# Patient Record
Sex: Male | Born: 1970 | Race: White | Hispanic: No | Marital: Married | Smoking: Former smoker
Health system: Southern US, Community
[De-identification: ages and names within clinical notes are randomized; demographics above are authoritative.]

## PROBLEM LIST (undated history)

## (undated) DIAGNOSIS — Z860100 Personal history of colon polyps, unspecified: Secondary | ICD-10-CM

## (undated) DIAGNOSIS — F419 Anxiety disorder, unspecified: Secondary | ICD-10-CM

## (undated) DIAGNOSIS — K219 Gastro-esophageal reflux disease without esophagitis: Secondary | ICD-10-CM

## (undated) DIAGNOSIS — N451 Epididymitis: Secondary | ICD-10-CM

## (undated) DIAGNOSIS — K589 Irritable bowel syndrome without diarrhea: Secondary | ICD-10-CM

## (undated) DIAGNOSIS — K5731 Diverticulosis of large intestine without perforation or abscess with bleeding: Secondary | ICD-10-CM

## (undated) DIAGNOSIS — K5900 Constipation, unspecified: Secondary | ICD-10-CM

## (undated) HISTORY — DX: Gilbert syndrome: E80.4

## (undated) HISTORY — DX: Epididymitis: N45.1

## (undated) HISTORY — DX: Anxiety disorder, unspecified: F41.9

## (undated) HISTORY — PX: TOE SURGERY: SHX1073

## (undated) HISTORY — DX: Constipation, unspecified: K59.00

## (undated) HISTORY — DX: Diverticulosis of large intestine without perforation or abscess with bleeding: K57.31

## (undated) HISTORY — DX: Personal history of colonic polyps: Z86.010

## (undated) HISTORY — DX: Gastro-esophageal reflux disease without esophagitis: K21.9

## (undated) HISTORY — DX: Personal history of colon polyps, unspecified: Z86.0100

## (undated) HISTORY — DX: Irritable bowel syndrome, unspecified: K58.9

---

## 2005-04-01 ENCOUNTER — Ambulatory Visit: Payer: Self-pay | Admitting: Family Medicine

## 2009-05-01 ENCOUNTER — Ambulatory Visit (HOSPITAL_BASED_OUTPATIENT_CLINIC_OR_DEPARTMENT_OTHER): Admission: RE | Admit: 2009-05-01 | Discharge: 2009-05-01 | Payer: Self-pay | Admitting: Orthopedic Surgery

## 2010-10-23 LAB — POCT HEMOGLOBIN-HEMACUE: Hemoglobin: 15.1 g/dL (ref 13.0–17.0)

## 2016-02-03 ENCOUNTER — Emergency Department (HOSPITAL_COMMUNITY): Payer: BLUE CROSS/BLUE SHIELD

## 2016-02-03 ENCOUNTER — Emergency Department (HOSPITAL_COMMUNITY)
Admission: EM | Admit: 2016-02-03 | Discharge: 2016-02-03 | Disposition: A | Discharge status: Home / Self Care | Payer: BLUE CROSS/BLUE SHIELD | Attending: Emergency Medicine | Admitting: Emergency Medicine

## 2016-02-03 ENCOUNTER — Encounter (HOSPITAL_COMMUNITY): Payer: Self-pay | Admitting: Emergency Medicine

## 2016-02-03 DIAGNOSIS — R079 Chest pain, unspecified: Secondary | ICD-10-CM

## 2016-02-03 DIAGNOSIS — Z91040 Latex allergy status: Secondary | ICD-10-CM | POA: Diagnosis not present

## 2016-02-03 DIAGNOSIS — R0789 Other chest pain: Secondary | ICD-10-CM | POA: Insufficient documentation

## 2016-02-03 LAB — BASIC METABOLIC PANEL
ANION GAP: 6 (ref 5–15)
BUN: 13 mg/dL (ref 6–20)
CALCIUM: 9.4 mg/dL (ref 8.9–10.3)
CO2: 26 mmol/L (ref 22–32)
Chloride: 104 mmol/L (ref 101–111)
Creatinine, Ser: 0.98 mg/dL (ref 0.61–1.24)
GFR calc non Af Amer: >60 mL/min (ref >60)
GLUCOSE: 103 mg/dL — AB (ref 65–99)
POTASSIUM: 4.4 mmol/L (ref 3.5–5.1)
Sodium: 136 mmol/L (ref 135–145)

## 2016-02-03 LAB — CBC
HEMATOCRIT: 42 % (ref 39.0–52.0)
HEMOGLOBIN: 13.8 g/dL (ref 13.0–17.0)
MCH: 30.1 pg (ref 26.0–34.0)
MCHC: 32.9 g/dL (ref 30.0–36.0)
MCV: 91.5 fL (ref 78.0–100.0)
Platelets: 197 10*3/uL (ref 150–400)
RBC: 4.59 MIL/uL (ref 4.22–5.81)
RDW: 13.2 % (ref 11.5–15.5)
WBC: 7 10*3/uL (ref 4.0–10.5)

## 2016-02-03 LAB — I-STAT TROPONIN, ED
TROPONIN I, POC: 0 ng/mL (ref 0.00–0.08)
TROPONIN I, POC: 0 ng/mL (ref 0.00–0.08)

## 2016-02-03 LAB — D-DIMER, QUANTITATIVE: D-Dimer, Quant: 0.31 ug/mL-FEU (ref 0.00–0.50)

## 2016-02-03 NOTE — Discharge Instructions (Signed)
Mr. Jack Mills,  Nice meeting you! Please follow-up with cardiology and your primary care provider as needed. Return to the emergency department if your chest pain returns, you develop shortness of breath, you have new/worsening symptoms. Feel better soon!  S. Lane HackerNicole Author Hatlestad, PA-C

## 2016-02-03 NOTE — ED Provider Notes (Signed)
CSN: [id number]     Arrival date & time 02/03/16  1054 History   First MD Initiated Contact with Patient 02/03/16 1155     Chief Complaint  Patient presents with  . Chest Pain   HPI   Jack Mills is a 45 y.o. male presenting with chest pain that began at 10 am this morning. He describes his pain as pressure, 5/10 pain scale, similar to gas pains, constant, lasting 30 minutes, right sided to midsternal in location, nonradiating, pleuritic. He denies fevers, chills, nausea, vomiting, changes in bowel or bladder habits. He endorses a history of smoking but states he quit 5 years ago.  History reviewed. No pertinent past medical history. Past Surgical History  Procedure Laterality Date  . Toe surgery     No family history on file. Social History  Substance Use Topics  . Smoking status: Never Smoker   . Smokeless tobacco: None  . Alcohol Use: Yes    Review of Systems  Ten systems are reviewed and are negative for acute change except as noted in the HPI  Allergies  Latex  Home Medications   Prior to Admission medications   Medication Sig Start Date End Date Taking? Authorizing Provider  fexofenadine (ALLEGRA) 180 MG tablet Take 180 mg by mouth daily. 11/04/15  Yes Historical Provider, MD  Phenyleph-CPM-DM-APAP (ALKA-SELTZER PLUS COLD & COUGH) 11-18-08-325 MG CAPS Take 1 packet by mouth 2 (two) times daily as needed (cough/cold).   Yes Historical Provider, MD   BP 132/94 mmHg  Pulse 67  Temp(Src) 98.5 F (36.9 C) (Oral)  Resp 20  Ht 6' (1.829 m)  Wt 95.255 kg  BMI 28.47 kg/m2  SpO2 100% Physical Exam  Constitutional: He appears well-developed and well-nourished. No distress.  HENT:  Head: Normocephalic and atraumatic.  Mouth/Throat: Oropharynx is clear and moist. No oropharyngeal exudate.  Eyes: Conjunctivae are normal. Pupils are equal, round, and reactive to light. Right eye exhibits no discharge. Left eye exhibits no discharge. No scleral icterus.  Neck: No tracheal  deviation present.  Cardiovascular: Normal rate, regular rhythm, normal heart sounds and intact distal pulses.  Exam reveals no gallop and no friction rub.   No murmur heard. Pulmonary/Chest: Effort normal and breath sounds normal. No respiratory distress. He has no wheezes. He has no rales. He exhibits no tenderness.  Abdominal: Soft. Bowel sounds are normal. He exhibits no distension and no mass. There is no tenderness. There is no rebound and no guarding.  Musculoskeletal: He exhibits no edema.  Lymphadenopathy:    He has no cervical adenopathy.  Neurological: He is alert. Coordination normal.  Skin: Skin is warm and dry. No rash noted. He is not diaphoretic. No erythema.  Psychiatric: He has a normal mood and affect. His behavior is normal.  Nursing note and vitals reviewed.   ED Course  Procedures  Labs Review Labs Reviewed  BASIC METABOLIC PANEL - Abnormal; Notable for the following:    Glucose, Bld 103 (*)    All other components within normal limits  CBC  D-DIMER, QUANTITATIVE (NOT AT [facility])  Rosezena Sensor, ED  Rosezena Sensor, ED   Imaging Review Dg Chest 2 View  02/03/2016  CLINICAL DATA:  Mid chest pain and dyspnea.  No time course given. EXAM: CHEST  2 VIEW COMPARISON:  None. FINDINGS: The heart size and mediastinal contours are within normal limits. Both lungs are clear. The visualized skeletal structures are unremarkable. IMPRESSION: Normal chest x-ray Electronically Signed   By: Rudie Meyer  M.D.   On: 02/03/2016 12:23   I have personally reviewed and evaluated these images and lab results as part of my medical decision-making.   EKG Interpretation   Date/Time:  Monday February 03 2016 11:02:22 EDT Ventricular Rate:  64 PR Interval:  176 QRS Duration: 96 QT Interval:  404 QTC Calculation: 416 R Axis:   88 Text Interpretation:  Normal sinus rhythm Normal ECG no st elevation or pr  interval depressions tht are concerning No old tracing to compare  Confirmed by  Rhunette CroftNANAVATI, MD, Janey GentaANKIT [id number]) on 02/03/2016 1:25:40 PM      MDM   Final diagnoses:  Chest pain, unspecified chest pain type   Patient nontoxic-appearing, vital signs stable. Patient is to be discharged with recommendation to follow up with PCP in regards to today's hospital visit. Chest pain is not likely of cardiac or pulmonary etiology d/t presentation, d-dimer negative, VSS, no tracheal deviation, no JVD or new murmur, RRR, breath sounds equal bilaterally, EKG without acute abnormalities, negative troponin x2, and negative CXR. Pt has been advised to return to the ED if CP becomes exertional, associated with diaphoresis or nausea, radiates to left jaw/arm, worsens or becomes concerning in any way. Pt appears reliable for follow up and is agreeable to discharge.   Melton KrebsSamantha Nicole Tylee Yum, PA-C 02/03/16 1531  Derwood KaplanAnkit Nanavati, MD 02/06/16 [phone]

## 2016-02-03 NOTE — ED Notes (Signed)
PA at bedside.

## 2016-02-03 NOTE — ED Notes (Signed)
Pt c/o right sided chest pain that radiated into center of chest onset today while standing. Pt reports when he sat down the pain eased.

## 2017-05-31 HISTORY — PX: ESOPHAGOGASTRODUODENOSCOPY: SHX1529

## 2017-05-31 HISTORY — PX: COLONOSCOPY: SHX174

## 2017-08-25 IMAGING — CR DG CHEST 2V
2 series · 2 of 2 positions shown · non-contrast
Comparison: None.

CLINICAL DATA: Mid chest pain and dyspnea.  No time course given.

EXAM:
CHEST  2 VIEW

[chest pa]
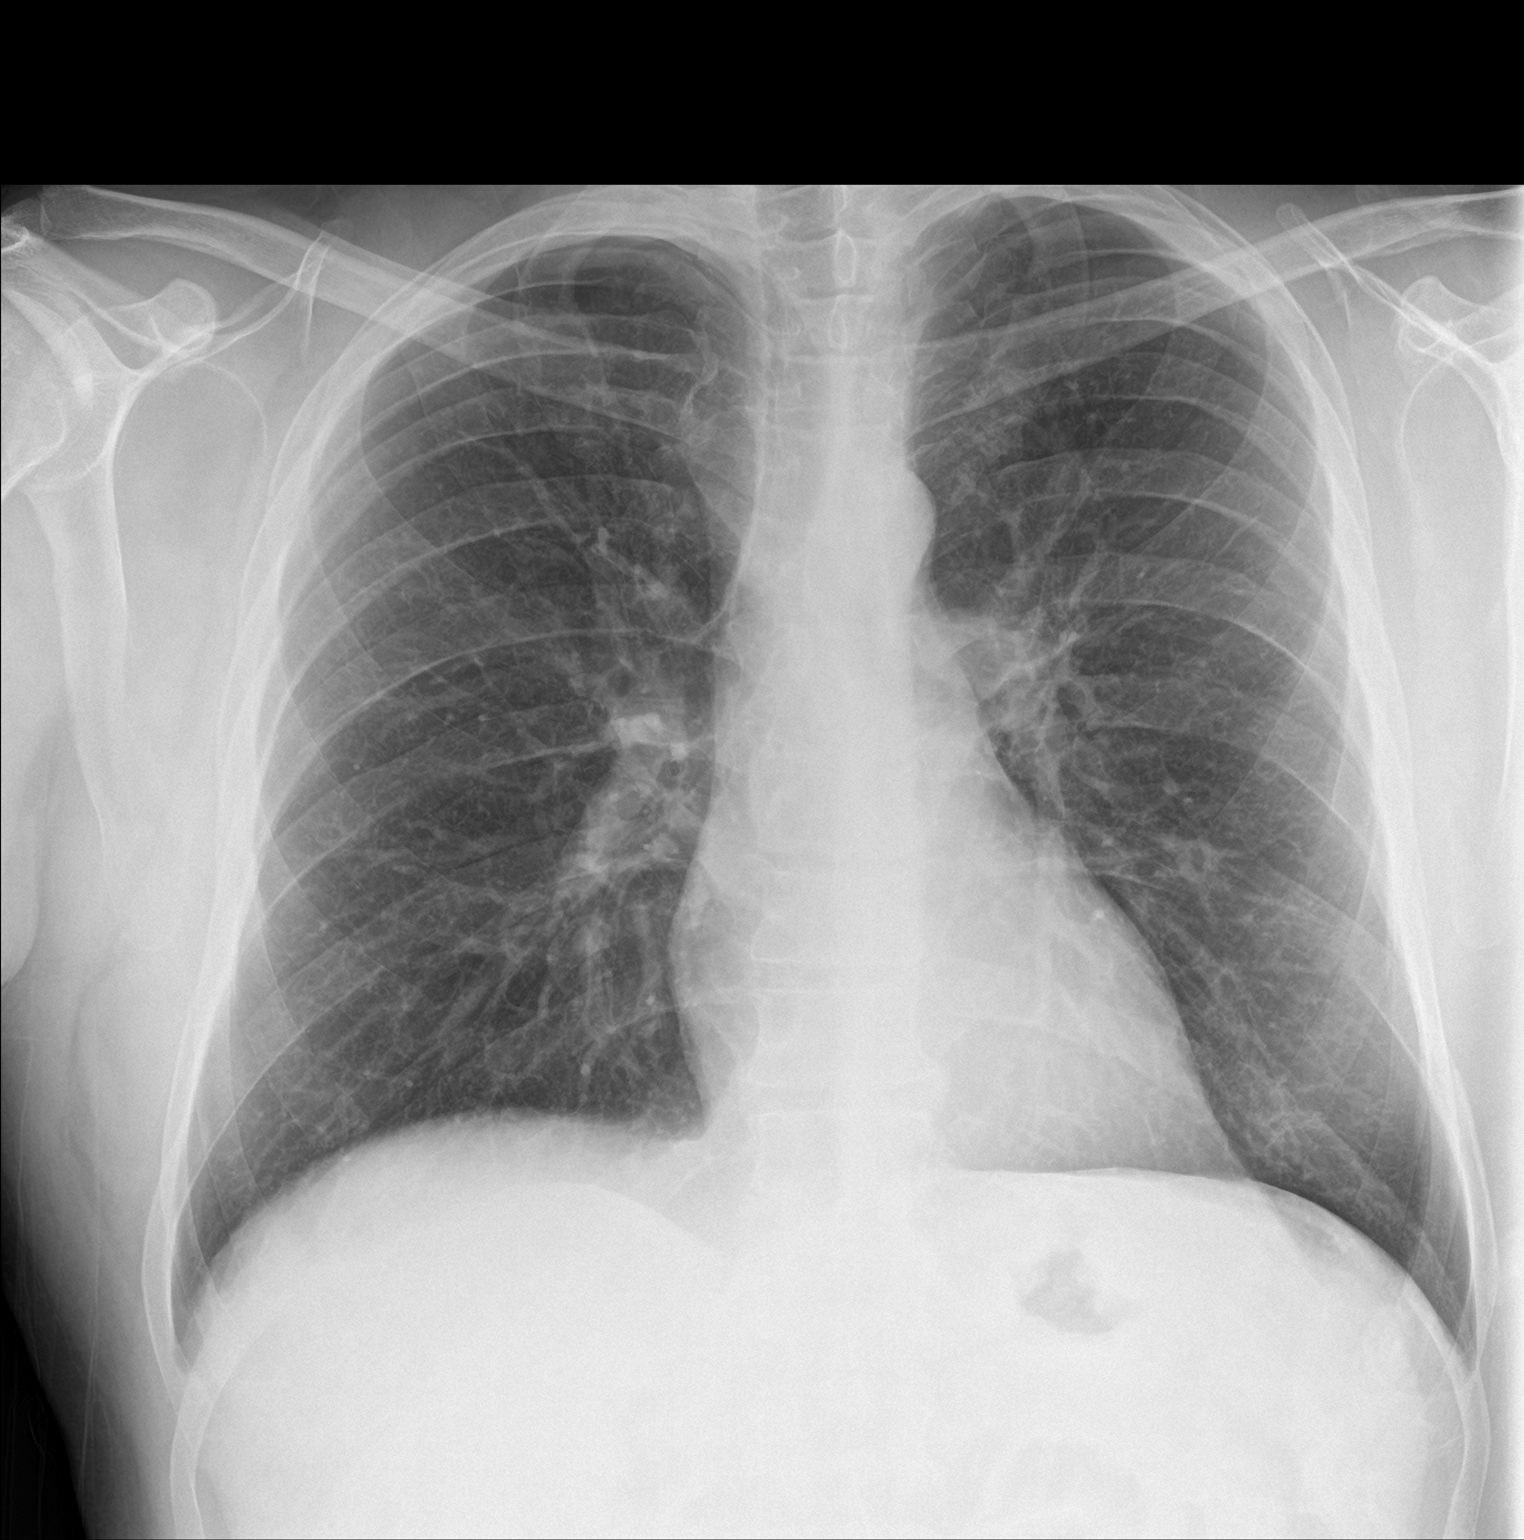

[chest lat]
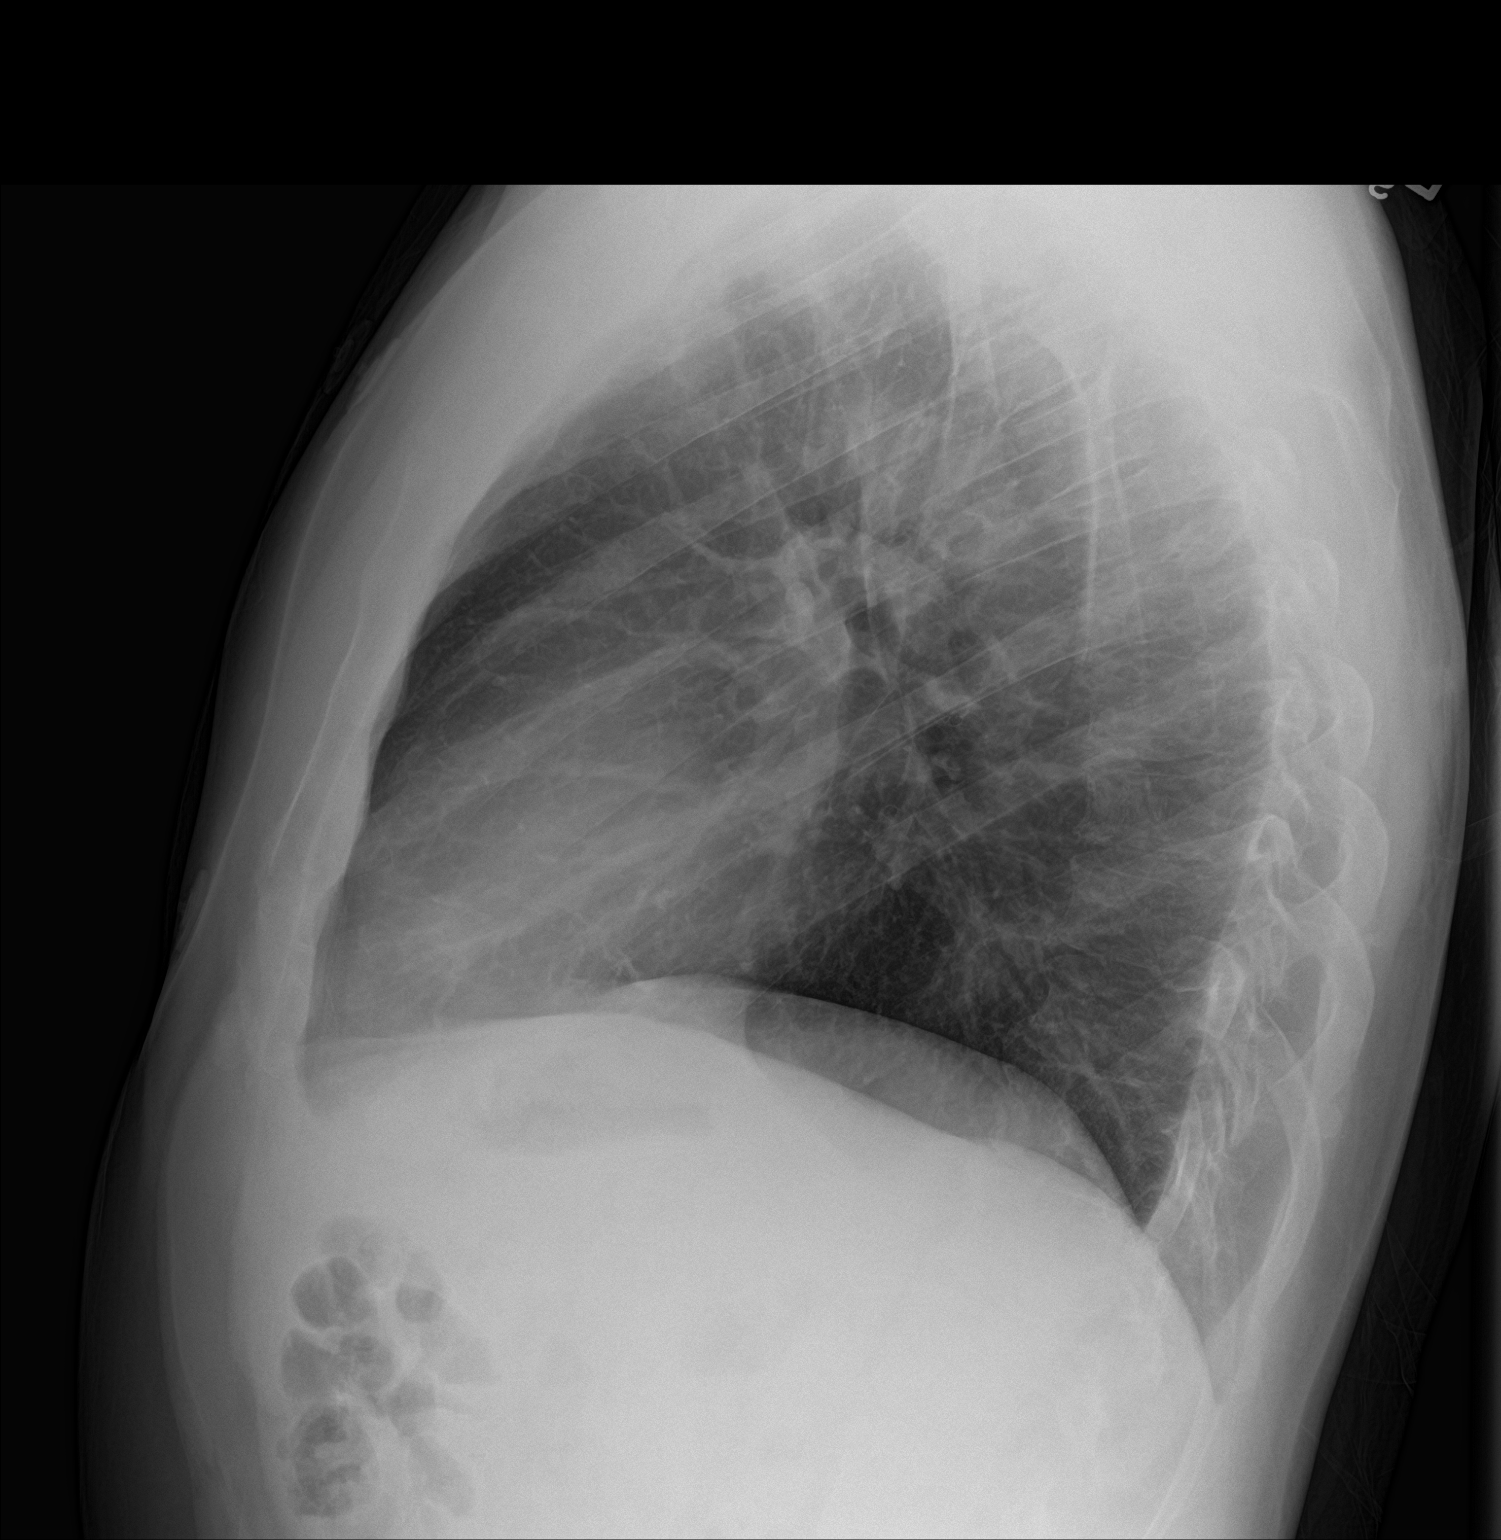

[2 of 2 positions shown; findings below may reference images not displayed]

FINDINGS: The heart size and mediastinal contours are within normal limits.
Both lungs are clear. The visualized skeletal structures are
unremarkable.
IMPRESSION: Normal chest x-ray

## 2018-03-15 ENCOUNTER — Ambulatory Visit: Payer: BLUE CROSS/BLUE SHIELD | Admitting: Allergy

## 2018-03-15 ENCOUNTER — Encounter: Payer: Self-pay | Admitting: Allergy

## 2018-03-15 VITALS — BP 124/84 | HR 68 | Temp 97.6°F | Resp 18 | Ht 72.5 in | Wt 214.4 lb

## 2018-03-15 DIAGNOSIS — J309 Allergic rhinitis, unspecified: Secondary | ICD-10-CM

## 2018-03-15 DIAGNOSIS — H1010 Acute atopic conjunctivitis, unspecified eye: Secondary | ICD-10-CM

## 2018-03-15 DIAGNOSIS — T781XXA Other adverse food reactions, not elsewhere classified, initial encounter: Secondary | ICD-10-CM | POA: Diagnosis not present

## 2018-03-15 MED ORDER — AZELASTINE HCL 0.1 % NA SOLN: NASAL | 5 refills | Status: DC

## 2018-03-15 MED ORDER — OLOPATADINE HCL 0.7 % OP SOLN
1.0000 [drp] | Freq: Every day | OPHTHALMIC | 5 refills | Status: DC | PRN
Start: 2018-03-15 — End: 2019-04-11

## 2018-03-15 NOTE — Progress Notes (Signed)
New Patient Note  RE: Jack Mills MRN: [id number] DOB: December 18, 1970 Date of Office Visit: 03/15/2018  Referring provider: Lynann Bologna, MD Primary care provider: Hadley Pen, MD  Chief Complaint: possible food allergy  History of present illness: Jack Mills is a 47 y.o. male presenting today for consultation for possible food allergy.  He was referred by his GI doctor, Dr. Chales Abrahams.   He states he had colonoscopy and endoscopy around Nov 2018.  He states he had esophagitis and was put on an inhaler to be swallowed for about 4-6 weeks to help with the esophagitis and was also recommended he have food allergy testing performed.  He states he is not sure if he was told he has eosinophilic esophagitis however.  He stopped the swallowed steroid after th 4-6 weeks timeframe and has not used since.  He states he has not had any further GI/esophageal symptoms since he stopped use of the swallowed steroid.  He has not had a repeat endoscopy after stopping the swallowed inhaler.  He does take omeprazole daily but states has not taken in the past week.   He does state that he was having heartburn symptoms and difficulty swallowing and felt like food was "sitting" in his chest.  He states rice was a problematic food for him but has no longer been an issue.  He also feels that tomato based sauces seem to cause heartburn symptoms.  Otherwise he has not identified any particular foods that would cause heartburn or swallowing difficulties.     He does report he has developed an itchy rash but is not sure if it is food related or not.   He states he does have seasonal allergies.  He reports symptoms of sneezing, itchy/watery eyes, post-nasal drip.  He states he will take allegra daily for past year but has has been off the past week for this visit.  He states he sometimes will take an alka seltzer cold which also has helped when his allergy symptoms are more severe.  He has used nasal sprays but has been  years since last used.    No history of asthma.   He states he does have some issues with eczema mostly on his arm crease and inner thigh and states he has had a prescription cream that he has had for years that does help but needs to use it very rarely.    Review of systems: Review of Systems  Constitutional: Negative for chills, fever and malaise/fatigue.  HENT: Positive for congestion. Negative for ear discharge, ear pain, nosebleeds and sore throat.   Eyes: Negative for pain, discharge and redness.  Respiratory: Negative for cough, shortness of breath and wheezing.   Cardiovascular: Negative for chest pain.  Gastrointestinal: Negative for abdominal pain, constipation, diarrhea, heartburn, nausea and vomiting.  Musculoskeletal: Negative for joint pain.  Skin: Negative for itching and rash.  Neurological: Negative for headaches.    All other systems negative unless noted above in HPI  Past medical history: Past Medical History:  Diagnosis Date  . Acid reflux     Past surgical history: Past Surgical History:  Procedure Laterality Date  . TOE SURGERY      Family history:  Family History  Problem Relation Age of Onset  . Stroke Maternal Grandfather   . Stroke Paternal Grandfather     Social history: He lives in a home with carpeting with gas heating and window cooling.  Dog in the home.  No concern for roaches  in the home.  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  He is a former smoker with quit date in spring 2013.    Medication List: Allergies as of 03/15/2018      Reactions   Latex Itching      Medication List        Accurate as of 03/15/18 12:03 PM. Always use your most recent med list.          ALPRAZolam 0.5 MG tablet Commonly known as:  XANAX Take 0.5 mg by mouth as needed.   fexofenadine 180 MG tablet Commonly known as:  ALLEGRA Take 180 mg by mouth daily.   omeprazole 20 MG capsule Commonly known as:  PRILOSEC   sildenafil 20 MG tablet Commonly known as:   REVATIO Use up to 4 one hour prior to sex prn ED       Known medication allergies: Allergies  Allergen Reactions  . Latex Itching     Physical examination: Blood pressure 124/84, pulse 68, temperature 97.6 F (36.4 C), temperature source Oral, resp. rate 18, height 6' 0.5" (1.842 m), weight 214 lb 6.4 oz (97.3 kg).  General: Alert, interactive, in no acute distress. HEENT: PERRLA, TMs pearly gray, turbinates moderately edematous with clear discharge, post-pharynx non erythematous. Neck: Supple without lymphadenopathy. Lungs: Clear to auscultation without wheezing, rhonchi or rales. {no increased work of breathing. CV: Normal S1, S2 without murmurs. Abdomen: Nondistended, nontender. Skin: Warm and dry, without lesions or rashes. Extremities:  No clubbing, cyanosis or edema. Neuro:   Grossly intact.  Diagnositics/Labs:  Allergy testing: environmental allergy skin prick testing is positive to meadow fescue, dust mite and cat.  Intradermal testing is positive to mold mix 2, dog and cockroach Select food allergy skin prick testing is negative to peanut, soybean, wheat, sesame, milk, egg, casein, tree nuts, salmon, shrimp, scallops, barley, hops, rice, pork, chicken, tomato, mushrooms, avocado, onion, banna, strawberry, watermelon, pineapple, chocolate Allergy testing results were read and interpreted by provider, documented by clinical staff.   Assessment and plan:   Adverse food reaction     - history appears consistent with possible eosinophilic esophagitis (EOE).  Symptoms of EOE can include difficulty swallowing, food impaction, heartburn symptoms.  Treatment options typically including PPI (like omeprazole) and swallowed steroid via inhaler.    - food allergy has been implicated as leading to symptoms.     Food allergen skin prick tests today were negative today despite a positive histamine control. The negative predictive value for food allergen skin testing is excellent,  with the exception of milk. However, false negatives may occur, particularly with milk. Therefore if symptoms start again or progress elimination diet may be warranted and would recommend starting with milk/dairy  Other foods to consider in elimination include soy, eggs, wheat, peanuts/tree nuts, and seafood.  Follow up with Dr. Chales AbrahamsGupta as scheduled for monitoring.  Allergic rhinoconjunctivitis   - environmental allergy skin testing today is positive to grass pollen, ragweed pollen, molds, dust mites, cat, dog, cockroach   - continue Allegra 180mg  daily   - for nasal drainage/post-nasal drip recommend use of nasal antihistamine, Astelin 2 sprays each nostril twice a day as needed   - for itchy/watery/red eyes recommend use of Pazeo or Pataday eye drop 1 drop each eye daily as needed    - allergen immunotherapy discussed today including protocol, benefits and risk.  Informational handout provided.  If interested in this therapuetic option you can check with your insurance carrier for coverage.  Let us know  if you would like to proceed with this option.    Follow-up 6-9 months or sooner if needed  I appreciate the opportunity to take part in Jack Mills's care. Please do not hesitate to contact me with questions.  Sincerely,   Margo Aye, MD Allergy/Immunology [facility]

## 2018-03-15 NOTE — Patient Instructions (Signed)
Adverse food reaction     - history appears consistent with possible eosinophilic esophagitis (EOE).  Symptoms of EOE can include difficulty swallowing, food impaction, heartburn symptoms.  Treatment options typically including PPI (like omeprazole) and swallowed steroid via inhaler.    - food allergy has been implicated as leading to symptoms.     Food allergen skin prick tests today were negative today despite a positive histamine control. The negative predictive value for food allergen skin testing is excellent, with the exception of milk. However, false negatives may occur, particularly with milk. Therefore if symptoms start again or progress elimination diet may be warranted and would recommend starting with milk/dairy  Other foods to consider in elimination include soy, eggs, wheat, peanuts/tree nuts, and seafood.  Follow up with Dr. Chales AbrahamsGupta as scheduled for monitoring.  Allergic rhinoconjunctivitis   - environmental allergy skin testing today is positive to grass pollen, ragweed pollen, molds, dust mites, cat, dog, cockroach   - continue Allegra 180mg  daily   - for nasal drainage/post-nasal drip recommend use of nasal antihistamine, Astelin 2 sprays each nostril twice a day as needed   - for itchy/watery/red eyes recommend use of Pazeo or Pataday eye drop 1 drop each eye daily as needed    - allergen immunotherapy discussed today including protocol, benefits and risk.  Informational handout provided.  If interested in this therapuetic option you can check with your insurance carrier for coverage.  Let us know if you would like to proceed with this option.    Follow-up 6-9 months or sooner if needed

## 2018-03-25 NOTE — Addendum Note (Signed)
Addended by: Lorrin Mais on: 03/25/2018 05:00 PM   Modules accepted: Orders

## 2018-03-30 ENCOUNTER — Encounter: Payer: Self-pay | Admitting: Allergy

## 2018-03-30 ENCOUNTER — Encounter: Payer: Self-pay | Admitting: *Deleted

## 2018-03-30 NOTE — Progress Notes (Signed)
Vials made. Exp: 03-31-19. hv 

## 2018-03-31 DIAGNOSIS — J301 Allergic rhinitis due to pollen: Secondary | ICD-10-CM | POA: Diagnosis not present

## 2018-04-01 DIAGNOSIS — J3089 Other allergic rhinitis: Secondary | ICD-10-CM | POA: Diagnosis not present

## 2018-04-04 ENCOUNTER — Encounter: Payer: Self-pay | Admitting: Gastroenterology

## 2018-04-05 ENCOUNTER — Encounter

## 2018-04-05 ENCOUNTER — Encounter: Payer: Self-pay | Admitting: Gastroenterology

## 2018-04-05 ENCOUNTER — Ambulatory Visit (INDEPENDENT_AMBULATORY_CARE_PROVIDER_SITE_OTHER): Payer: BLUE CROSS/BLUE SHIELD | Admitting: Gastroenterology

## 2018-04-05 VITALS — BP 122/72 | HR 69 | Ht 72.0 in | Wt 210.0 lb

## 2018-04-05 DIAGNOSIS — K5900 Constipation, unspecified: Secondary | ICD-10-CM

## 2018-04-05 NOTE — Patient Instructions (Signed)
If you are age 45 or older, your body mass index should be between 23-30. Your Body mass index is 28.48 kg/m. If this is out of the aforementioned range listed, please consider follow up with your Primary Care Provider.  If you are age 45 or younger, your body mass index should be between 19-25. Your Body mass index is 28.48 kg/m. If this is out of the aformentioned range listed, please consider follow up with your Primary Care Provider.   Thank you,  Dr. Lynann Bolognaajesh Gupta

## 2018-04-05 NOTE — Progress Notes (Signed)
Chief Complaint: Follow-up  Referring Provider: Dr. Sherral Hammers      ASSESSMENT AND PLAN;   #1.  IBS with constipation, neg CT except for constipation 02/26/2018 at [facility]: [facility], neg colonoscopy 05/2017 except for very minimal sigmoid diverticulosis.  Repeat in 10 years.  Earlier, if with any red flag symptoms. Plan: - Benefiber 2 TBS po qd - Increase water intake. - CT report from [facility]. -Patient is to call us if he has any problems in the future.  He is on recall colonoscopy.    HPI:    Jack Mills is a 47 y.o. male  Seen in the emergency room at [facility] on 02/26/2018 with generalized abdominal and back pain. He underwent CT scan which showed significant constipation He was given MiraLAX 17 g p.o. 3 times daily with good results. He has stopped taking MiraLAX and uses it on as-needed basis. Started Benefiber with complete resolution of abdominal pain, abdominal bloating or back pain. Pleased with the progress Came since he had made this appointment a month ago.    Past Medical History:  Diagnosis Date  . Acid reflux   . Anxiety   . Constipation   . Diverticulosis of colon with hemorrhage   . Epididymitis   . Gilbert's disease     Past Surgical History:  Procedure Laterality Date  . COLONOSCOPY  05/31/2017   Very minimal sigmoid diverticulosis.   Marland Kitchen ESOPHAGOGASTRODUODENOSCOPY  05/31/2017   Mild gastirtis. Small hiatal hernia. Irregular Z line.   . TOE SURGERY      Family History  Problem Relation Age of Onset  . Stroke Maternal Grandfather   . Stroke Paternal Grandfather     Social History   Tobacco Use  . Smoking status: Former Smoker    Types: Cigarettes  . Smokeless tobacco: Never Used  Substance Use Topics  . Alcohol use: Yes    Alcohol/week: 18.0 standard drinks    Types: 18 Standard drinks or equivalent per week  . Drug use: No    Current Outpatient Medications  Medication Sig Dispense Refill  . ALPRAZolam (XANAX) 0.5 MG tablet Take 0.5 mg by  mouth as needed.     Marland Kitchen azelastine (ASTELIN) 0.1 % nasal spray Use two sprays in each nostril twice daily if needed 30 mL 5  . fexofenadine (ALLEGRA) 180 MG tablet Take 180 mg by mouth daily.    . Olopatadine HCl (PAZEO) 0.7 % SOLN Place 1 drop into both eyes daily as needed. 1 Bottle 5  . omeprazole (PRILOSEC) 20 MG capsule     . Probiotic Product (PROBIOTIC PO) Take by mouth.    . sildenafil (REVATIO) 20 MG tablet Use up to 4 one hour prior to sex prn ED     No current facility-administered medications for this visit.     Allergies  Allergen Reactions  . Latex Itching    Review of Systems:  Constitutional: Denies fever, chills, diaphoresis, appetite change and fatigue.  HEENT: Denies photophobia, eye pain, redness, hearing loss, ear pain, congestion, sore throat, rhinorrhea, sneezing, mouth sores, neck pain, neck stiffness and tinnitus.   Respiratory: Denies SOB, DOE, cough, chest tightness,  and wheezing.   Cardiovascular: Denies chest pain, palpitations and leg swelling.  Genitourinary: Denies dysuria, urgency, frequency, hematuria, flank pain and difficulty urinating.  Musculoskeletal: Denies myalgias, back pain, joint swelling, arthralgias and gait problem.  Skin: No rash.  Neurological: Denies dizziness, seizures, syncope, weakness, light-headedness, numbness and headaches.  Hematological: Denies adenopathy. Easy bruising, personal or  family bleeding history  Psychiatric/Behavioral: No anxiety or depression     Physical Exam:    BP 122/72   Pulse 69   Ht 6' (1.829 m)   Wt 210 lb (95.3 kg)   BMI 28.48 kg/m  Filed Weights   04/05/18 1354  Weight: 210 lb (95.3 kg)   Constitutional:  Well-developed, in no acute distress. Psychiatric: Normal mood and affect. Behavior is normal. HEENT: Pupils normal.  Conjunctivae are normal. No scleral icterus. Neck supple.  Cardiovascular: Normal rate, regular rhythm. No edema Pulmonary/chest: Effort normal and breath sounds normal.  No wheezing, rales or rhonchi. Abdominal: Soft, nondistended. Nontender. Bowel sounds active throughout. There are no masses palpable. No hepatomegaly. Rectal:  defered Neurological: Alert and oriented to person place and time. Skin: Skin is warm and dry. No rashes noted.  Data Reviewed: I have personally reviewed following labs and imaging studies  CBC: CBC Latest Ref Rng & Units 02/03/2016 05/01/2009  WBC 4.0 - 10.5 K/uL 7.0 -  Hemoglobin 13.0 - 17.0 g/dL 40.913.8 81.115.1  Hematocrit 91.439.0 - 52.0 % 42.0 -  Platelets 150 - 400 K/uL 197 -    CMP: CMP Latest Ref Rng & Units 02/03/2016  Glucose 65 - 99 mg/dL 782(N103(H)  BUN 6 - 20 mg/dL 13  Creatinine 5.620.61 - 1.301.24 mg/dL 8.650.98  Sodium 784135 - 696145 mmol/L 136  Potassium 3.5 - 5.1 mmol/L 4.4  Chloride 101 - 111 mmol/L 104  CO2 22 - 32 mmol/L 26  Calcium 8.9 - 10.3 mg/dL 9.4      Edman Circleaj Riane Rung, MD 04/05/2018, 2:02 PM  Cc: Dr Sherral Hammersobbins

## 2018-04-11 ENCOUNTER — Ambulatory Visit (INDEPENDENT_AMBULATORY_CARE_PROVIDER_SITE_OTHER): Payer: BLUE CROSS/BLUE SHIELD | Admitting: *Deleted

## 2018-04-11 DIAGNOSIS — J309 Allergic rhinitis, unspecified: Secondary | ICD-10-CM | POA: Diagnosis not present

## 2018-04-11 MED ORDER — AUVI-Q 0.3 MG/0.3ML IJ SOAJ: INTRAMUSCULAR | 3 refills | Status: AC

## 2018-04-11 NOTE — Progress Notes (Signed)
Immunotherapy   Patient Details  Name: Stormy FabianBrandon Shaver MRN: [id number] Date of Birth: 31-Aug-1970  04/11/2018  Stormy FabianBrandon Muzquiz  Following schedule: B  Frequency: 1-2 TIMES WEEKLY Epi-Pen: YES Consent signed and patient instructions given.   Redge GainerShannon N Thompson 04/11/2018, 9:11 AM

## 2018-04-18 ENCOUNTER — Ambulatory Visit (INDEPENDENT_AMBULATORY_CARE_PROVIDER_SITE_OTHER): Payer: BLUE CROSS/BLUE SHIELD | Admitting: *Deleted

## 2018-04-18 DIAGNOSIS — J309 Allergic rhinitis, unspecified: Secondary | ICD-10-CM

## 2018-04-21 ENCOUNTER — Ambulatory Visit (INDEPENDENT_AMBULATORY_CARE_PROVIDER_SITE_OTHER): Payer: BLUE CROSS/BLUE SHIELD | Admitting: *Deleted

## 2018-04-21 DIAGNOSIS — J309 Allergic rhinitis, unspecified: Secondary | ICD-10-CM | POA: Diagnosis not present

## 2018-04-25 ENCOUNTER — Ambulatory Visit (INDEPENDENT_AMBULATORY_CARE_PROVIDER_SITE_OTHER): Payer: BLUE CROSS/BLUE SHIELD | Admitting: *Deleted

## 2018-04-25 DIAGNOSIS — J309 Allergic rhinitis, unspecified: Secondary | ICD-10-CM | POA: Diagnosis not present

## 2018-04-28 ENCOUNTER — Ambulatory Visit (INDEPENDENT_AMBULATORY_CARE_PROVIDER_SITE_OTHER): Payer: BLUE CROSS/BLUE SHIELD | Admitting: *Deleted

## 2018-04-28 DIAGNOSIS — J309 Allergic rhinitis, unspecified: Secondary | ICD-10-CM | POA: Diagnosis not present

## 2018-05-02 ENCOUNTER — Ambulatory Visit (INDEPENDENT_AMBULATORY_CARE_PROVIDER_SITE_OTHER): Payer: BLUE CROSS/BLUE SHIELD | Admitting: *Deleted

## 2018-05-02 DIAGNOSIS — J309 Allergic rhinitis, unspecified: Secondary | ICD-10-CM | POA: Diagnosis not present

## 2018-05-05 ENCOUNTER — Ambulatory Visit (INDEPENDENT_AMBULATORY_CARE_PROVIDER_SITE_OTHER): Payer: BLUE CROSS/BLUE SHIELD | Admitting: *Deleted

## 2018-05-05 DIAGNOSIS — J309 Allergic rhinitis, unspecified: Secondary | ICD-10-CM

## 2018-05-09 ENCOUNTER — Ambulatory Visit (INDEPENDENT_AMBULATORY_CARE_PROVIDER_SITE_OTHER): Payer: BLUE CROSS/BLUE SHIELD | Admitting: *Deleted

## 2018-05-09 DIAGNOSIS — J309 Allergic rhinitis, unspecified: Secondary | ICD-10-CM

## 2018-05-12 ENCOUNTER — Ambulatory Visit (INDEPENDENT_AMBULATORY_CARE_PROVIDER_SITE_OTHER): Payer: BLUE CROSS/BLUE SHIELD | Admitting: *Deleted

## 2018-05-12 DIAGNOSIS — J309 Allergic rhinitis, unspecified: Secondary | ICD-10-CM | POA: Diagnosis not present

## 2018-05-16 ENCOUNTER — Ambulatory Visit (INDEPENDENT_AMBULATORY_CARE_PROVIDER_SITE_OTHER): Payer: BLUE CROSS/BLUE SHIELD | Admitting: *Deleted

## 2018-05-16 DIAGNOSIS — J309 Allergic rhinitis, unspecified: Secondary | ICD-10-CM

## 2018-05-19 ENCOUNTER — Ambulatory Visit (INDEPENDENT_AMBULATORY_CARE_PROVIDER_SITE_OTHER): Payer: BLUE CROSS/BLUE SHIELD | Admitting: *Deleted

## 2018-05-19 DIAGNOSIS — J309 Allergic rhinitis, unspecified: Secondary | ICD-10-CM | POA: Diagnosis not present

## 2018-05-23 ENCOUNTER — Ambulatory Visit (INDEPENDENT_AMBULATORY_CARE_PROVIDER_SITE_OTHER): Payer: BLUE CROSS/BLUE SHIELD | Admitting: *Deleted

## 2018-05-23 DIAGNOSIS — J309 Allergic rhinitis, unspecified: Secondary | ICD-10-CM

## 2018-05-26 ENCOUNTER — Ambulatory Visit (INDEPENDENT_AMBULATORY_CARE_PROVIDER_SITE_OTHER): Payer: BLUE CROSS/BLUE SHIELD | Admitting: *Deleted

## 2018-05-26 DIAGNOSIS — J309 Allergic rhinitis, unspecified: Secondary | ICD-10-CM

## 2018-05-30 ENCOUNTER — Ambulatory Visit (INDEPENDENT_AMBULATORY_CARE_PROVIDER_SITE_OTHER): Payer: BLUE CROSS/BLUE SHIELD | Admitting: *Deleted

## 2018-05-30 DIAGNOSIS — J309 Allergic rhinitis, unspecified: Secondary | ICD-10-CM

## 2018-06-02 ENCOUNTER — Ambulatory Visit (INDEPENDENT_AMBULATORY_CARE_PROVIDER_SITE_OTHER): Payer: BLUE CROSS/BLUE SHIELD | Admitting: *Deleted

## 2018-06-02 DIAGNOSIS — J309 Allergic rhinitis, unspecified: Secondary | ICD-10-CM

## 2018-06-06 ENCOUNTER — Ambulatory Visit (INDEPENDENT_AMBULATORY_CARE_PROVIDER_SITE_OTHER): Payer: BLUE CROSS/BLUE SHIELD | Admitting: *Deleted

## 2018-06-06 DIAGNOSIS — J309 Allergic rhinitis, unspecified: Secondary | ICD-10-CM

## 2018-06-09 ENCOUNTER — Ambulatory Visit (INDEPENDENT_AMBULATORY_CARE_PROVIDER_SITE_OTHER): Payer: BLUE CROSS/BLUE SHIELD | Admitting: *Deleted

## 2018-06-09 DIAGNOSIS — J309 Allergic rhinitis, unspecified: Secondary | ICD-10-CM

## 2018-06-13 ENCOUNTER — Ambulatory Visit (INDEPENDENT_AMBULATORY_CARE_PROVIDER_SITE_OTHER): Payer: BLUE CROSS/BLUE SHIELD | Admitting: *Deleted

## 2018-06-13 DIAGNOSIS — J309 Allergic rhinitis, unspecified: Secondary | ICD-10-CM | POA: Diagnosis not present

## 2018-06-20 ENCOUNTER — Ambulatory Visit (INDEPENDENT_AMBULATORY_CARE_PROVIDER_SITE_OTHER): Payer: BLUE CROSS/BLUE SHIELD | Admitting: *Deleted

## 2018-06-20 DIAGNOSIS — J309 Allergic rhinitis, unspecified: Secondary | ICD-10-CM

## 2018-06-27 ENCOUNTER — Ambulatory Visit (INDEPENDENT_AMBULATORY_CARE_PROVIDER_SITE_OTHER): Payer: BLUE CROSS/BLUE SHIELD | Admitting: *Deleted

## 2018-06-27 DIAGNOSIS — J309 Allergic rhinitis, unspecified: Secondary | ICD-10-CM | POA: Diagnosis not present

## 2018-07-04 ENCOUNTER — Ambulatory Visit (INDEPENDENT_AMBULATORY_CARE_PROVIDER_SITE_OTHER): Payer: BLUE CROSS/BLUE SHIELD | Admitting: *Deleted

## 2018-07-04 DIAGNOSIS — J309 Allergic rhinitis, unspecified: Secondary | ICD-10-CM | POA: Diagnosis not present

## 2018-07-12 NOTE — Progress Notes (Signed)
This encounter was created in error - please disregard.

## 2018-07-15 ENCOUNTER — Ambulatory Visit (INDEPENDENT_AMBULATORY_CARE_PROVIDER_SITE_OTHER): Payer: BLUE CROSS/BLUE SHIELD

## 2018-07-15 DIAGNOSIS — J309 Allergic rhinitis, unspecified: Secondary | ICD-10-CM

## 2018-07-18 ENCOUNTER — Ambulatory Visit (INDEPENDENT_AMBULATORY_CARE_PROVIDER_SITE_OTHER): Payer: BLUE CROSS/BLUE SHIELD

## 2018-07-18 DIAGNOSIS — J309 Allergic rhinitis, unspecified: Secondary | ICD-10-CM | POA: Diagnosis not present

## 2018-07-25 ENCOUNTER — Ambulatory Visit (INDEPENDENT_AMBULATORY_CARE_PROVIDER_SITE_OTHER): Payer: BLUE CROSS/BLUE SHIELD | Admitting: *Deleted

## 2018-07-25 DIAGNOSIS — J309 Allergic rhinitis, unspecified: Secondary | ICD-10-CM | POA: Diagnosis not present

## 2018-08-01 ENCOUNTER — Ambulatory Visit (INDEPENDENT_AMBULATORY_CARE_PROVIDER_SITE_OTHER): Payer: BLUE CROSS/BLUE SHIELD

## 2018-08-01 DIAGNOSIS — J309 Allergic rhinitis, unspecified: Secondary | ICD-10-CM | POA: Diagnosis not present

## 2018-08-02 NOTE — Progress Notes (Signed)
VIALS EXP 1--14-2021 

## 2018-08-04 DIAGNOSIS — J301 Allergic rhinitis due to pollen: Secondary | ICD-10-CM | POA: Diagnosis not present

## 2018-08-08 ENCOUNTER — Ambulatory Visit (INDEPENDENT_AMBULATORY_CARE_PROVIDER_SITE_OTHER): Payer: BLUE CROSS/BLUE SHIELD

## 2018-08-08 DIAGNOSIS — J309 Allergic rhinitis, unspecified: Secondary | ICD-10-CM

## 2018-08-18 ENCOUNTER — Ambulatory Visit (INDEPENDENT_AMBULATORY_CARE_PROVIDER_SITE_OTHER): Payer: BLUE CROSS/BLUE SHIELD | Admitting: *Deleted

## 2018-08-18 DIAGNOSIS — J309 Allergic rhinitis, unspecified: Secondary | ICD-10-CM | POA: Diagnosis not present

## 2018-08-22 ENCOUNTER — Ambulatory Visit (INDEPENDENT_AMBULATORY_CARE_PROVIDER_SITE_OTHER): Payer: BLUE CROSS/BLUE SHIELD | Admitting: *Deleted

## 2018-08-22 DIAGNOSIS — J309 Allergic rhinitis, unspecified: Secondary | ICD-10-CM

## 2018-08-29 ENCOUNTER — Ambulatory Visit (INDEPENDENT_AMBULATORY_CARE_PROVIDER_SITE_OTHER): Payer: BLUE CROSS/BLUE SHIELD

## 2018-08-29 DIAGNOSIS — J309 Allergic rhinitis, unspecified: Secondary | ICD-10-CM

## 2018-09-05 ENCOUNTER — Ambulatory Visit (INDEPENDENT_AMBULATORY_CARE_PROVIDER_SITE_OTHER): Payer: BLUE CROSS/BLUE SHIELD | Admitting: *Deleted

## 2018-09-05 DIAGNOSIS — J309 Allergic rhinitis, unspecified: Secondary | ICD-10-CM

## 2018-09-12 ENCOUNTER — Ambulatory Visit (INDEPENDENT_AMBULATORY_CARE_PROVIDER_SITE_OTHER): Payer: BLUE CROSS/BLUE SHIELD | Admitting: *Deleted

## 2018-09-12 DIAGNOSIS — J309 Allergic rhinitis, unspecified: Secondary | ICD-10-CM

## 2018-09-19 ENCOUNTER — Ambulatory Visit (INDEPENDENT_AMBULATORY_CARE_PROVIDER_SITE_OTHER): Payer: BLUE CROSS/BLUE SHIELD | Admitting: *Deleted

## 2018-09-19 DIAGNOSIS — J309 Allergic rhinitis, unspecified: Secondary | ICD-10-CM

## 2018-09-26 ENCOUNTER — Ambulatory Visit (INDEPENDENT_AMBULATORY_CARE_PROVIDER_SITE_OTHER): Payer: BLUE CROSS/BLUE SHIELD | Admitting: *Deleted

## 2018-09-26 DIAGNOSIS — J309 Allergic rhinitis, unspecified: Secondary | ICD-10-CM

## 2018-10-06 ENCOUNTER — Ambulatory Visit (INDEPENDENT_AMBULATORY_CARE_PROVIDER_SITE_OTHER): Payer: BLUE CROSS/BLUE SHIELD | Admitting: *Deleted

## 2018-10-06 DIAGNOSIS — J309 Allergic rhinitis, unspecified: Secondary | ICD-10-CM

## 2018-10-10 ENCOUNTER — Ambulatory Visit (INDEPENDENT_AMBULATORY_CARE_PROVIDER_SITE_OTHER): Payer: BLUE CROSS/BLUE SHIELD | Admitting: *Deleted

## 2018-10-10 DIAGNOSIS — J309 Allergic rhinitis, unspecified: Secondary | ICD-10-CM

## 2018-10-20 ENCOUNTER — Ambulatory Visit (INDEPENDENT_AMBULATORY_CARE_PROVIDER_SITE_OTHER): Payer: BLUE CROSS/BLUE SHIELD | Admitting: *Deleted

## 2018-10-20 DIAGNOSIS — J309 Allergic rhinitis, unspecified: Secondary | ICD-10-CM

## 2018-10-27 ENCOUNTER — Ambulatory Visit (INDEPENDENT_AMBULATORY_CARE_PROVIDER_SITE_OTHER): Payer: Self-pay | Admitting: *Deleted

## 2018-10-27 DIAGNOSIS — J309 Allergic rhinitis, unspecified: Secondary | ICD-10-CM

## 2018-10-31 ENCOUNTER — Ambulatory Visit (INDEPENDENT_AMBULATORY_CARE_PROVIDER_SITE_OTHER): Payer: Self-pay | Admitting: *Deleted

## 2018-10-31 DIAGNOSIS — J309 Allergic rhinitis, unspecified: Secondary | ICD-10-CM

## 2018-11-01 DIAGNOSIS — J301 Allergic rhinitis due to pollen: Secondary | ICD-10-CM

## 2018-11-01 NOTE — Progress Notes (Signed)
VIALS EXP 11-01-2019 

## 2018-11-10 ENCOUNTER — Ambulatory Visit (INDEPENDENT_AMBULATORY_CARE_PROVIDER_SITE_OTHER): Payer: Self-pay | Admitting: *Deleted

## 2018-11-10 DIAGNOSIS — J309 Allergic rhinitis, unspecified: Secondary | ICD-10-CM

## 2018-11-17 ENCOUNTER — Ambulatory Visit (INDEPENDENT_AMBULATORY_CARE_PROVIDER_SITE_OTHER): Payer: Self-pay | Admitting: *Deleted

## 2018-11-17 DIAGNOSIS — J309 Allergic rhinitis, unspecified: Secondary | ICD-10-CM

## 2018-11-24 ENCOUNTER — Ambulatory Visit (INDEPENDENT_AMBULATORY_CARE_PROVIDER_SITE_OTHER): Payer: Self-pay | Admitting: *Deleted

## 2018-11-24 DIAGNOSIS — J309 Allergic rhinitis, unspecified: Secondary | ICD-10-CM

## 2018-12-01 ENCOUNTER — Ambulatory Visit (INDEPENDENT_AMBULATORY_CARE_PROVIDER_SITE_OTHER): Payer: Self-pay | Admitting: *Deleted

## 2018-12-01 DIAGNOSIS — J309 Allergic rhinitis, unspecified: Secondary | ICD-10-CM

## 2018-12-08 ENCOUNTER — Ambulatory Visit (INDEPENDENT_AMBULATORY_CARE_PROVIDER_SITE_OTHER): Payer: Self-pay

## 2018-12-08 DIAGNOSIS — J309 Allergic rhinitis, unspecified: Secondary | ICD-10-CM

## 2018-12-15 ENCOUNTER — Ambulatory Visit (INDEPENDENT_AMBULATORY_CARE_PROVIDER_SITE_OTHER): Payer: Self-pay | Admitting: *Deleted

## 2018-12-15 DIAGNOSIS — J309 Allergic rhinitis, unspecified: Secondary | ICD-10-CM

## 2018-12-29 ENCOUNTER — Ambulatory Visit (INDEPENDENT_AMBULATORY_CARE_PROVIDER_SITE_OTHER): Payer: Self-pay | Admitting: *Deleted

## 2018-12-29 DIAGNOSIS — J309 Allergic rhinitis, unspecified: Secondary | ICD-10-CM

## 2019-01-12 ENCOUNTER — Ambulatory Visit (INDEPENDENT_AMBULATORY_CARE_PROVIDER_SITE_OTHER): Payer: Self-pay

## 2019-01-12 DIAGNOSIS — J309 Allergic rhinitis, unspecified: Secondary | ICD-10-CM

## 2019-01-26 ENCOUNTER — Ambulatory Visit (INDEPENDENT_AMBULATORY_CARE_PROVIDER_SITE_OTHER): Payer: Self-pay | Admitting: *Deleted

## 2019-01-26 DIAGNOSIS — J309 Allergic rhinitis, unspecified: Secondary | ICD-10-CM

## 2019-02-09 ENCOUNTER — Ambulatory Visit (INDEPENDENT_AMBULATORY_CARE_PROVIDER_SITE_OTHER): Payer: Self-pay | Admitting: *Deleted

## 2019-02-09 DIAGNOSIS — J309 Allergic rhinitis, unspecified: Secondary | ICD-10-CM

## 2019-02-14 DIAGNOSIS — J301 Allergic rhinitis due to pollen: Secondary | ICD-10-CM

## 2019-02-14 NOTE — Progress Notes (Signed)
Vials exp 02-14-2020 

## 2019-03-02 ENCOUNTER — Ambulatory Visit (INDEPENDENT_AMBULATORY_CARE_PROVIDER_SITE_OTHER): Payer: Self-pay | Admitting: *Deleted

## 2019-03-02 DIAGNOSIS — J309 Allergic rhinitis, unspecified: Secondary | ICD-10-CM

## 2019-03-16 ENCOUNTER — Ambulatory Visit (INDEPENDENT_AMBULATORY_CARE_PROVIDER_SITE_OTHER): Payer: Self-pay | Admitting: *Deleted

## 2019-03-16 DIAGNOSIS — J309 Allergic rhinitis, unspecified: Secondary | ICD-10-CM

## 2019-03-23 ENCOUNTER — Ambulatory Visit (INDEPENDENT_AMBULATORY_CARE_PROVIDER_SITE_OTHER): Payer: Self-pay | Admitting: *Deleted

## 2019-03-23 DIAGNOSIS — J309 Allergic rhinitis, unspecified: Secondary | ICD-10-CM

## 2019-03-30 ENCOUNTER — Ambulatory Visit (INDEPENDENT_AMBULATORY_CARE_PROVIDER_SITE_OTHER): Payer: Self-pay | Admitting: *Deleted

## 2019-03-30 DIAGNOSIS — J309 Allergic rhinitis, unspecified: Secondary | ICD-10-CM

## 2019-04-06 ENCOUNTER — Ambulatory Visit (INDEPENDENT_AMBULATORY_CARE_PROVIDER_SITE_OTHER): Payer: Self-pay | Admitting: *Deleted

## 2019-04-06 DIAGNOSIS — J309 Allergic rhinitis, unspecified: Secondary | ICD-10-CM

## 2019-04-11 ENCOUNTER — Telehealth: Payer: Self-pay | Admitting: Gastroenterology

## 2019-04-11 ENCOUNTER — Encounter: Payer: Self-pay | Admitting: Gastroenterology

## 2019-04-11 ENCOUNTER — Other Ambulatory Visit: Payer: Self-pay

## 2019-04-11 ENCOUNTER — Ambulatory Visit (INDEPENDENT_AMBULATORY_CARE_PROVIDER_SITE_OTHER): Payer: Self-pay | Admitting: Gastroenterology

## 2019-04-11 VITALS — BP 120/88 | HR 80 | Temp 98.0°F | Ht 72.0 in | Wt 196.0 lb

## 2019-04-11 DIAGNOSIS — R1032 Left lower quadrant pain: Secondary | ICD-10-CM

## 2019-04-11 DIAGNOSIS — K5900 Constipation, unspecified: Secondary | ICD-10-CM

## 2019-04-11 MED ORDER — HYOSCYAMINE SULFATE 0.125 MG SL SUBL: 0.1250 mg | SUBLINGUAL_TABLET | Freq: Four times a day (QID) | SUBLINGUAL | 0 refills | Status: DC | PRN

## 2019-04-11 MED ORDER — LINACLOTIDE 72 MCG PO CAPS: 72.0000 ug | ORAL_CAPSULE | Freq: Every day | ORAL | 11 refills | Status: DC

## 2019-04-11 NOTE — Patient Instructions (Signed)
If you are age 45 or older, your body mass index should be between 23-30. Your Body mass index is 26.58 kg/m. If this is out of the aforementioned range listed, please consider follow up with your Primary Care Provider.  If you are age 61 or younger, your body mass index should be between 19-25. Your Body mass index is 26.58 kg/m. If this is out of the aformentioned range listed, please consider follow up with your Primary Care Provider.   Please purchase the following medications over the counter and take as directed: Metamucil one tablespoon once daily.  Increase water intake.   We have sent the following medications to your pharmacy for you to pick up at your convenience: Hyoscyamine  Linzess  At your physical appointment please have the following labs drawn:  CBC, CMP, TSH, fasting Lipid, CRP, B12, Celiac    Gluten-Free Diet for Celiac Disease, Adult  The gluten-free diet includes all foods that do not contain gluten. Gluten is a protein that is found in wheat, rye, barley, and some other grains. Following the gluten-free diet is the only treatment for people with celiac disease. It helps to prevent damage to the intestines and improves or eliminates the symptoms of celiac disease. Following the gluten-free diet requires some planning. It can be challenging at first, but it gets easier with time and practice. There are more gluten-free options available today than ever before. If you need help finding gluten-free foods or if you have questions, talk with your diet and nutrition specialist (registered dietitian) or your health care provider. What do I need to know about a gluten-free diet?  All fruits, vegetables, and meats are safe to eat and do not contain gluten.  When grocery shopping, start by shopping in the produce, meat, and dairy sections. These sections are more likely to contain gluten-free foods. Then move to the aisles that contain packaged foods if you need to.  Read all  food labels. Gluten is often added to foods. Always check the ingredient list and look for warnings, such as "may contain gluten."  Talk with your dietitian or health care provider before taking a gluten-free multivitamin or mineral supplement.  Be aware of gluten-free foods having contact with foods that contain gluten (cross-contamination). This can happen at home and with any processed foods. ? Talk with your health care provider or dietitian about how to reduce the risk of cross-contamination in your home. ? If you have questions about how a food is processed, ask the manufacturer. What key words help to identify gluten? Foods that list any of these key words on the label usually contain gluten:  Wheat, flour, enriched flour, bromated flour, white flour, durum flour, graham flour, phosphated flour, self-rising flour, semolina, farina, barley (malt), rye, and oats.  Starch, dextrin, modified food starch, or cereal.  Thickening, fillers, or emulsifiers.  Malt flavoring, malt extract, or malt syrup.  Hydrolyzed vegetable protein. In the [place] packaged foods that are gluten-free are required to be labeled "GF." These foods should be easy to identify and are safe to eat. In the [place] food companies are also required to list common food allergens, including wheat, on their labels. Recommended foods Grains  Amaranth, bean flours, 100% buckwheat flour, corn, millet, nut flours or nut meals, GF oats, quinoa, rice, sorghum, teff, rice wafers, pure cornmeal tortillas, popcorn, and hot cereals made from cornmeal. Hominy, rice, wild rice. Some Asian rice noodles or bean noodles. Arrowroot starch, corn bran, corn flour, corn germ, cornmeal,  corn starch, potato flour, potato starch flour, and rice bran. Plain, brown, and sweet rice flours. Rice polish, soy flour, and tapioca starch. Vegetables  All plain fresh, frozen, and canned vegetables. Fruits  All plain fresh, frozen, canned, and dried  fruits, and 100% fruit juices. Meats and other protein foods  All fresh beef, pork, poultry, fish, seafood, and eggs. Fish canned in water, oil, brine, or vegetable broth. Plain nuts and seeds, peanut butter. Some lunch meat and some frankfurters. Dried beans, dried peas, and lentils. Dairy  Fresh plain, dry, evaporated, or condensed milk. Cream, butter, sour cream, whipping cream, and most yogurts. Unprocessed cheese, most processed cheeses, some cottage cheese, some cream cheeses. Beverages  Coffee, tea, most herbal teas. Carbonated beverages and some root beers. Wine, sake, and distilled spirits, such as gin, vodka, and whiskey. Most hard ciders. Fats and oils  Butter, margarine, vegetable oil, hydrogenated butter, olive oil, shortening, lard, cream, and some mayonnaise. Some commercial salad dressings. Olives. Sweets and desserts  Sugar, honey, some syrups, molasses, jelly, and jam. Plain hard candy, marshmallows, and gumdrops. Pure cocoa powder. Plain chocolate. Custard and some pudding mixes. Gelatin desserts, sorbets, frozen ice pops, and sherbet. Cake, cookies, and other desserts prepared with allowed flours. Some commercial ice creams. Cornstarch, tapioca, and rice puddings. Seasoning and other foods  Some canned or frozen soups. Monosodium glutamate (MSG). Cider, rice, and wine vinegar. Baking soda and baking powder. Cream of tartar. Baking and nutritional yeast. Certain soy sauces made without wheat (ask your dietitian about specific brands that are allowed). Nuts, coconut, and chocolate. Salt, pepper, herbs, spices, flavoring extracts, imitation or artificial flavorings, natural flavorings, and food colorings. Some medicines and supplements. Some lip glosses and other cosmetics. Rice syrups. The items listed may not be a complete list. Talk with your dietitian about what dietary choices are best for you. Foods to avoid Grains  Barley, bran, bulgur, couscous, cracked wheat, [place],  farro, graham, malt, matzo, semolina, wheat germ, and all wheat and rye cereals including spelt and kamut. Cereals containing malt as a flavoring, such as rice cereal. Noodles, spaghetti, macaroni, most packaged rice mixes, and all mixes containing wheat, rye, barley, or triticale. Vegetables  Most creamed vegetables and most vegetables canned in sauces. Some commercially prepared vegetables and salads. Fruits  Thickened or prepared fruits and some pie fillings. Some fruit snacks and fruit roll-ups. Meats and other protein foods  Any meat or meat alternative containing wheat, rye, barley, or gluten stabilizers. These are often marinated or packaged meats and lunch meats. Bread-containing products, such as Swiss steak, croquettes, meatballs, and meatloaf. Most tuna canned in vegetable broth and [place] with hydrolyzed vegetable protein (HVP) injected as part of the basting. Seitan. Imitation fish. Eggs in sauces made from ingredients to avoid. Dairy  Commercial chocolate milk drinks and malted milk. Some non-dairy creamers. Any cheese product containing ingredients to avoid. Beverages  Certain cereal beverages. Beer, ale, malted milk, and some root beers. Some hard ciders. Some instant flavored coffees. Some herbal teas made with barley or with barley malt added. Fats and oils  Some commercial salad dressings. Sour cream containing modified food starch. Sweets and desserts  Some toffees. Chocolate-coated nuts (may be rolled in wheat flour) and some commercial candies and candy bars. Most cakes, cookies, donuts, pastries, and other baked goods. Some commercial ice cream. Ice cream cones. Commercially prepared mixes for cakes, cookies, and other desserts. Bread pudding and other puddings thickened with flour. Products containing brown rice syrup made  with barley malt enzyme. Desserts and sweets made with malt flavoring. Seasoning and other foods  Some curry powders, some dry seasoning mixes, some  gravy extracts, some meat sauces, some ketchups, some prepared mustards, and horseradish. Certain soy sauces. Malt vinegar. Bouillon and bouillon cubes that contain HVP. Some chip dips, and some chewing gum. Yeast extract. Brewer's yeast. Caramel color. Some medicines and supplements. Some lip glosses and other cosmetics. The items listed may not be a complete list. Talk with your dietitian about what dietary choices are best for you. Summary  Gluten is a protein that is found in wheat, rye, barley, and some other grains. The gluten-free diet includes all foods that do not contain gluten.  If you need help finding gluten-free foods or if you have questions, talk with your diet and nutrition specialist (registered dietitian) or your health care provider.  Read all food labels. Gluten is often added to foods. Always check the ingredient list and look for warnings, such as "may contain gluten." This information is not intended to replace advice given to you by your health care provider. Make sure you discuss any questions you have with your health care provider. Document Released: 07/06/2005 Document Revised: 06/18/2017 Document Reviewed: 04/20/2016 Elsevier Patient Education  2020 Elsevier Inc.   Thank you,  Dr. Lynann Bologna

## 2019-04-11 NOTE — Progress Notes (Signed)
Chief Complaint: Follow-up  Referring Provider: Dr. Unk Lightning      ASSESSMENT AND PLAN;   #1. LLQ pain. Neg CT AP 02/2018. S/P empiric treatment with Cipro/Flagyl x 10 days 2020. #2. IBS with constipation, neg CT except for constipation 02/26/2018 at [facility], neg colonoscopy 05/2017 except for very minimal sigmoid div.  Repeat in 10 years.  Earlier, if with any red flag symptoms. Neg skin allergy testing ( Dr Nelva Bush)  Plan: - Continue metamucil TBS po 3 times daily as he has been doing. - Increase water intake. - Hyscamine 0.125mg  ODT q6hrs prn #120. - Linzess 72 mcg po qd (samples given) - He is to schedule routine physcial exam with Dr. Kristian Covey checking CBC, CMP, TSH, fasting lipid, CRP, B12, celiac screen.  Given patient written instructions. - He is going to beach on vacation.  When he comes back, trial of gluten free diet and carbonation free diet (including no beer). - Patient is to call after his beach trip.    HPI:    Jack Mills is a 48 y.o. male   With left lower quadrant abdominal pain, change in bowel habits in form of constipation and occasionally diarrhea.  Had one episode of fever with chills prompting antibiotics with Cipro and Flagyl for 10 days.  He felt little bit better and then again started having problems.  Lately he has switched Benefiber to Metamucil with better relief.  Still complains of abdominal bloating.  Brings in several pictures of his stools.  Sometimes does not feel that he is "cleaned enough".  Had problems after drinking beer and eating chocolate chip cookies.   Seen in ER at [facility] on 02/26/2018 with generalized abdominal and back pain- underwent CT scan which showed significant constipation, 2.1 cm simple cyst in the right kidney. He was given MiraLAX 17 g p.o. 3 times daily with good results. He has stopped taking MiraLAX and uses it on as-needed basis.  Underwent colonoscopy on 05/31/2017 which showed very minimal  sigmoid diverticulosis, otherwise normal colonoscopy to TI.  Recommended to repeat colonoscopy in 10 years.  Going to the the beach for vacation.  Past Medical History:  Diagnosis Date  . Acid reflux   . Anxiety   . Constipation   . Diverticulosis of colon with hemorrhage   . Epididymitis   . Gilbert's disease   . History of colon polyps   . IBS (irritable bowel syndrome)     Past Surgical History:  Procedure Laterality Date  . COLONOSCOPY  05/31/2017   Very minimal sigmoid diverticulosis.   Marland Kitchen ESOPHAGOGASTRODUODENOSCOPY  05/31/2017   Mild gastirtis. Small hiatal hernia. Irregular Z line.   . TOE SURGERY      Family History  Problem Relation Age of Onset  . Stroke Maternal Grandfather   . Stroke Paternal Grandfather     Social History   Tobacco Use  . Smoking status: Former Smoker    Types: Cigarettes  . Smokeless tobacco: Never Used  Substance Use Topics  . Alcohol use: Yes    Alcohol/week: 18.0 standard drinks    Types: 18 Standard drinks or equivalent per week  . Drug use: No    Current Outpatient Medications  Medication Sig Dispense Refill  . ALPRAZolam (XANAX) 0.5 MG tablet Take 0.5 mg by mouth as needed.     Marland Kitchen AUVI-Q 0.3 MG/0.3ML SOAJ injection Use as directed for life threatening allergic reactions 2 Device 3  . azelastine (ASTELIN) 0.1 % nasal spray Use two sprays  in each nostril twice daily if needed 30 mL 5  . fexofenadine (ALLEGRA) 180 MG tablet Take 180 mg by mouth daily.    Marland Kitchen omeprazole (PRILOSEC) 20 MG capsule     . Probiotic Product (PROBIOTIC PO) Take by mouth.    . sildenafil (REVATIO) 20 MG tablet Use up to 4 one hour prior to sex prn ED     No current facility-administered medications for this visit.     Allergies  Allergen Reactions  . Latex Itching    Review of Systems:  neg     Physical Exam:    BP 120/88   Pulse 80   Temp 98 F (36.7 C)   Ht 6' (1.829 m)   Wt 196 lb (88.9 kg)   SpO2 96%   BMI 26.58 kg/m  Filed Weights    04/11/19 1419  Weight: 196 lb (88.9 kg)   Constitutional:  Well-developed, in no acute distress. Psychiatric: Normal mood and affect. Behavior is normal. HEENT: Pupils normal.  Conjunctivae are normal. No scleral icterus. Neck supple.  Cardiovascular: Normal rate, regular rhythm. No edema Pulmonary/chest: Effort normal and breath sounds normal. No wheezing, rales or rhonchi. Abdominal: Soft, nondistended. Nontender. Bowel sounds active throughout. There are no masses palpable. No hepatomegaly. Rectal:  defered Neurological: Alert and oriented to person place and time. Skin: Skin is warm and dry. No rashes noted.  Data Reviewed: I have personally reviewed following labs and imaging studies  CBC: CBC Latest Ref Rng & Units 02/03/2016 05/01/2009  WBC 4.0 - 10.5 K/uL 7.0 -  Hemoglobin 13.0 - 17.0 g/dL 76.7 34.1  Hematocrit 93.7 - 52.0 % 42.0 -  Platelets 150 - 400 K/uL 197 -    CMP: CMP Latest Ref Rng & Units 02/03/2016  Glucose 65 - 99 mg/dL 902(I)  BUN 6 - 20 mg/dL 13  Creatinine 0.97 - 3.53 mg/dL 2.99  Sodium 242 - 683 mmol/L 136  Potassium 3.5 - 5.1 mmol/L 4.4  Chloride 101 - 111 mmol/L 104  CO2 22 - 32 mmol/L 26  Calcium 8.9 - 10.3 mg/dL 9.4  25 minutes spent with the patient today. Greater than 50% was spent in counseling and coordination of care with the patient     Edman Circle, MD 04/11/2019, 2:40 PM  Cc: Dr Sherral Hammers

## 2019-04-11 NOTE — Telephone Encounter (Signed)
Called and spoke with patient-informed patient of savings card available online for [email or web address] will tryto sign of for this savings card and submit to pharmacy   However, patient is requesting to know of alternative measures he can take in place of Linzess if he is still unable to pay for the Linzess 72 mg-  Please advise-

## 2019-04-12 NOTE — Telephone Encounter (Signed)
Lets arrange for samples until he returns from the beach (if he is not able to get Linzess) See note from yesterday  RG

## 2019-04-12 NOTE — Telephone Encounter (Signed)
Please assist this patient with Linzess 72 mg

## 2019-04-12 NOTE — Telephone Encounter (Signed)
I have called and spoke with patient he said he will come pick up samples tomorrow. Patient said even with savings card the Linzess is $335 a month, which he can not afford. Patient was given a financial assistance form from the pharmacy that he said he would fill out and bring to [place] to see if he would qualify to get the medication for free. Patient said if it didn't work that he would need to see if Dr. Lyndel Safe would switch the medication. I have told the patient to please contact us if that happens.

## 2019-04-27 ENCOUNTER — Ambulatory Visit (INDEPENDENT_AMBULATORY_CARE_PROVIDER_SITE_OTHER): Payer: Self-pay | Admitting: *Deleted

## 2019-04-27 DIAGNOSIS — J309 Allergic rhinitis, unspecified: Secondary | ICD-10-CM

## 2019-05-12 ENCOUNTER — Ambulatory Visit (INDEPENDENT_AMBULATORY_CARE_PROVIDER_SITE_OTHER): Payer: Self-pay | Admitting: *Deleted

## 2019-05-12 DIAGNOSIS — J309 Allergic rhinitis, unspecified: Secondary | ICD-10-CM

## 2019-05-26 ENCOUNTER — Ambulatory Visit (INDEPENDENT_AMBULATORY_CARE_PROVIDER_SITE_OTHER): Payer: Self-pay | Admitting: *Deleted

## 2019-05-26 DIAGNOSIS — J309 Allergic rhinitis, unspecified: Secondary | ICD-10-CM

## 2019-06-02 ENCOUNTER — Ambulatory Visit (INDEPENDENT_AMBULATORY_CARE_PROVIDER_SITE_OTHER): Payer: Self-pay | Admitting: *Deleted

## 2019-06-02 DIAGNOSIS — J309 Allergic rhinitis, unspecified: Secondary | ICD-10-CM

## 2019-06-14 ENCOUNTER — Ambulatory Visit (INDEPENDENT_AMBULATORY_CARE_PROVIDER_SITE_OTHER): Payer: Self-pay

## 2019-06-14 DIAGNOSIS — J309 Allergic rhinitis, unspecified: Secondary | ICD-10-CM

## 2019-06-23 ENCOUNTER — Ambulatory Visit (INDEPENDENT_AMBULATORY_CARE_PROVIDER_SITE_OTHER): Payer: Self-pay | Admitting: *Deleted

## 2019-06-23 DIAGNOSIS — J309 Allergic rhinitis, unspecified: Secondary | ICD-10-CM

## 2019-07-06 ENCOUNTER — Ambulatory Visit (INDEPENDENT_AMBULATORY_CARE_PROVIDER_SITE_OTHER): Payer: Self-pay

## 2019-07-06 DIAGNOSIS — J309 Allergic rhinitis, unspecified: Secondary | ICD-10-CM

## 2019-07-25 ENCOUNTER — Ambulatory Visit (INDEPENDENT_AMBULATORY_CARE_PROVIDER_SITE_OTHER): Payer: Self-pay | Admitting: *Deleted

## 2019-07-25 DIAGNOSIS — J309 Allergic rhinitis, unspecified: Secondary | ICD-10-CM

## 2019-08-01 NOTE — Progress Notes (Signed)
Vials exp 07-31-20 

## 2019-08-02 DIAGNOSIS — J301 Allergic rhinitis due to pollen: Secondary | ICD-10-CM

## 2019-08-09 ENCOUNTER — Encounter: Payer: Self-pay | Admitting: Allergy and Immunology

## 2019-08-10 ENCOUNTER — Ambulatory Visit (INDEPENDENT_AMBULATORY_CARE_PROVIDER_SITE_OTHER): Payer: Self-pay | Admitting: *Deleted

## 2019-08-10 DIAGNOSIS — J309 Allergic rhinitis, unspecified: Secondary | ICD-10-CM

## 2019-08-18 ENCOUNTER — Telehealth: Payer: Self-pay | Admitting: Gastroenterology

## 2019-08-18 ENCOUNTER — Ambulatory Visit (INDEPENDENT_AMBULATORY_CARE_PROVIDER_SITE_OTHER): Payer: Self-pay

## 2019-08-18 DIAGNOSIS — J309 Allergic rhinitis, unspecified: Secondary | ICD-10-CM

## 2019-08-18 MED ORDER — HYOSCYAMINE SULFATE 0.125 MG SL SUBL: 0.1250 mg | SUBLINGUAL_TABLET | Freq: Four times a day (QID) | SUBLINGUAL | 0 refills | Status: DC | PRN

## 2019-08-18 NOTE — Telephone Encounter (Signed)
Sent refill to patients pharmacy. 

## 2019-08-18 NOTE — Telephone Encounter (Signed)
Pt requested a refill for hyoscyamine.  He stated that quantity 30 would be sufficient.

## 2019-08-21 MED ORDER — HYOSCYAMINE SULFATE 0.125 MG SL SUBL: 0.1250 mg | SUBLINGUAL_TABLET | Freq: Four times a day (QID) | SUBLINGUAL | 0 refills | Status: DC | PRN

## 2019-08-21 NOTE — Telephone Encounter (Signed)
I have sent this to patients pharmacy

## 2019-08-21 NOTE — Addendum Note (Signed)
Addended by: Junius Roads on: 08/21/2019 12:17 PM   Modules accepted: Orders

## 2019-08-21 NOTE — Telephone Encounter (Signed)
Patient called to follow up states he requested the refill for a quantity of 30 tablets to be sent to [facility] on [address]

## 2019-09-08 ENCOUNTER — Ambulatory Visit (INDEPENDENT_AMBULATORY_CARE_PROVIDER_SITE_OTHER): Payer: Self-pay

## 2019-09-08 DIAGNOSIS — J309 Allergic rhinitis, unspecified: Secondary | ICD-10-CM

## 2019-09-14 ENCOUNTER — Ambulatory Visit (INDEPENDENT_AMBULATORY_CARE_PROVIDER_SITE_OTHER): Payer: Self-pay | Admitting: *Deleted

## 2019-09-14 DIAGNOSIS — J309 Allergic rhinitis, unspecified: Secondary | ICD-10-CM

## 2019-09-21 ENCOUNTER — Ambulatory Visit (INDEPENDENT_AMBULATORY_CARE_PROVIDER_SITE_OTHER): Payer: Self-pay | Admitting: *Deleted

## 2019-09-21 DIAGNOSIS — J309 Allergic rhinitis, unspecified: Secondary | ICD-10-CM

## 2019-09-28 ENCOUNTER — Ambulatory Visit (INDEPENDENT_AMBULATORY_CARE_PROVIDER_SITE_OTHER): Payer: Self-pay | Admitting: *Deleted

## 2019-09-28 DIAGNOSIS — J309 Allergic rhinitis, unspecified: Secondary | ICD-10-CM

## 2019-10-05 ENCOUNTER — Ambulatory Visit (INDEPENDENT_AMBULATORY_CARE_PROVIDER_SITE_OTHER): Payer: Self-pay | Admitting: *Deleted

## 2019-10-05 DIAGNOSIS — J309 Allergic rhinitis, unspecified: Secondary | ICD-10-CM

## 2019-10-18 ENCOUNTER — Telehealth: Payer: Self-pay | Admitting: Gastroenterology

## 2019-10-18 NOTE — Telephone Encounter (Signed)
Pt requested a refill for hyoscyamine rx written for 120 tablets instead of 30 as it is the same price.

## 2019-10-19 MED ORDER — HYOSCYAMINE SULFATE 0.125 MG SL SUBL: 0.1250 mg | SUBLINGUAL_TABLET | Freq: Four times a day (QID) | SUBLINGUAL | 0 refills | Status: DC | PRN

## 2019-10-19 NOTE — Telephone Encounter (Signed)
Pt called back states script was called to wrong pharmacy.  It needs to go to [facility] in [place].

## 2019-10-19 NOTE — Telephone Encounter (Signed)
I have sent prescription to [facility].

## 2019-10-19 NOTE — Telephone Encounter (Signed)
I have sent prescription to patient's pharmacy.  

## 2019-10-19 NOTE — Addendum Note (Signed)
Addended by: Junius Roads on: 10/19/2019 01:31 PM   Modules accepted: Orders

## 2019-10-26 ENCOUNTER — Ambulatory Visit (INDEPENDENT_AMBULATORY_CARE_PROVIDER_SITE_OTHER): Payer: Self-pay | Admitting: *Deleted

## 2019-10-26 DIAGNOSIS — J309 Allergic rhinitis, unspecified: Secondary | ICD-10-CM

## 2019-10-30 ENCOUNTER — Ambulatory Visit (INDEPENDENT_AMBULATORY_CARE_PROVIDER_SITE_OTHER): Payer: BC Managed Care – PPO | Admitting: Gastroenterology

## 2019-10-30 ENCOUNTER — Other Ambulatory Visit: Payer: Self-pay

## 2019-10-30 ENCOUNTER — Encounter: Payer: Self-pay | Admitting: Gastroenterology

## 2019-10-30 VITALS — BP 112/72 | HR 72 | Temp 97.5°F | Ht 72.0 in | Wt 198.1 lb

## 2019-10-30 DIAGNOSIS — K5900 Constipation, unspecified: Secondary | ICD-10-CM | POA: Diagnosis not present

## 2019-10-30 DIAGNOSIS — R1032 Left lower quadrant pain: Secondary | ICD-10-CM

## 2019-10-30 MED ORDER — HYOSCYAMINE SULFATE 0.125 MG SL SUBL: 0.1250 mg | SUBLINGUAL_TABLET | Freq: Four times a day (QID) | SUBLINGUAL | 4 refills | Status: AC | PRN

## 2019-10-30 NOTE — Patient Instructions (Signed)
If you are age 47 or older, your body mass index should be between 23-30. Your Body mass index is 26.87 kg/m. If this is out of the aforementioned range listed, please consider follow up with your Primary Care Provider.  If you are age 34 or younger, your body mass index should be between 19-25. Your Body mass index is 26.87 kg/m. If this is out of the aformentioned range listed, please consider follow up with your Primary Care Provider.   Continue current diet.   Increase water intake.   We have sent the following medications to your pharmacy for you to pick up at your convenience: Hyoscamine   Continue Metamucil 1/day until done.   Thank you,  Dr. Lynann Bologna

## 2019-10-30 NOTE — Progress Notes (Signed)
Chief Complaint: Follow-up  Referring Provider: Dr. Unk Lightning      ASSESSMENT AND PLAN;   #1. LLQ pain (resolved). Neg CT AP 02/2018. S/P empiric treatment with Cipro/Flagyl x 10 days 2020. #2. IBS with constipation, neg CT except for constipation 02/26/2018 at [facility], neg colonoscopy 05/2017 except for very minimal sigmoid div.  Repeat in 10 years.  Earlier, if with any red flag symptoms. Neg skin allergy testing ( Dr Nelva Bush) Nl CBC, CMP, TSH, celiac 04/2019. Failed linzess and gluten free diet.  Plan: - Continue current diet - Increase water intake. - Hyoscamine 0.125mg  ODT q6hrs prn #120. 4 refills. - Continue metamucil 1/day until done.    HPI:    Jack Mills is a 49 y.o. male   On 69 day diet. Feels much better.  The abdominal pain has completely resolved.  His constipation has more or less resolved as well.  He does see some mucus in the stool.  Denies having any abdominal bloating.  No melena or hematochezia.  No weight loss.  He did bring in several pictures of his stool -which looked normal (Bristol's grade 3-4)   From previous records/notes: seen in ER at [facility] on 02/26/2018 with generalized abdominal and back pain- underwent CT scan which showed significant constipation, 2.1 cm simple cyst in the right kidney. He was given MiraLAX 17 g p.o. 3 times daily with good results. He has stopped taking MiraLAX and uses it on as-needed basis.  Underwent colonoscopy on 05/31/2017 which showed very minimal sigmoid diverticulosis, otherwise normal colonoscopy to TI.  Recommended to repeat colonoscopy in 10 years.  Wt Readings from Last 3 Encounters:  10/30/19 198 lb 2 oz (89.9 kg)  04/11/19 196 lb (88.9 kg)  04/05/18 210 lb (95.3 kg)     Past Medical History:  Diagnosis Date  . Acid reflux   . Anxiety   . Constipation   . Diverticulosis of colon with hemorrhage   . Epididymitis   . Gilbert's disease   . History of colon polyps   . IBS (irritable bowel  syndrome)     Past Surgical History:  Procedure Laterality Date  . COLONOSCOPY  05/31/2017   Very minimal sigmoid diverticulosis.   Marland Kitchen ESOPHAGOGASTRODUODENOSCOPY  05/31/2017   Mild gastirtis. Small hiatal hernia. Irregular Z line.   . TOE SURGERY      Family History  Problem Relation Age of Onset  . Stroke Maternal Grandfather   . Stroke Paternal Grandfather     Social History   Tobacco Use  . Smoking status: Former Smoker    Types: Cigarettes  . Smokeless tobacco: Never Used  Substance Use Topics  . Alcohol use: Yes    Alcohol/week: 18.0 standard drinks    Types: 18 Standard drinks or equivalent per week  . Drug use: No    Current Outpatient Medications  Medication Sig Dispense Refill  . ALPRAZolam (XANAX) 0.5 MG tablet Take 0.5 mg by mouth as needed.     Marland Kitchen AUVI-Q 0.3 MG/0.3ML SOAJ injection Use as directed for life threatening allergic reactions 2 Device 3  . azelastine (ASTELIN) 0.1 % nasal spray Use two sprays in each nostril twice daily if needed 30 mL 5  . hyoscyamine (LEVSIN SL) 0.125 MG SL tablet Place 1 tablet (0.125 mg total) under the tongue every 6 (six) hours as needed. 120 tablet 0  . fexofenadine (ALLEGRA) 180 MG tablet Take 180 mg by mouth daily.    Marland Kitchen omeprazole (PRILOSEC) 20 MG capsule     .  sildenafil (REVATIO) 20 MG tablet Use up to 4 one hour prior to sex prn ED     No current facility-administered medications for this visit.    Allergies  Allergen Reactions  . Latex Itching    Review of Systems:  neg     Physical Exam:    BP 112/72   Pulse 72   Temp (!) 97.5 F (36.4 C)   Ht 6' (1.829 m)   Wt 198 lb 2 oz (89.9 kg)   BMI 26.87 kg/m  Filed Weights   10/30/19 1507  Weight: 198 lb 2 oz (89.9 kg)   Constitutional:  Well-developed, in no acute distress. Psychiatric: Normal mood and affect. Behavior is normal. HEENT: Pupils normal.  Conjunctivae are normal. No scleral icterus. Neck supple.  Cardiovascular: Normal rate, regular  rhythm. No edema Pulmonary/chest: Effort normal and breath sounds normal. No wheezing, rales or rhonchi. Abdominal: Soft, nondistended. Nontender. Bowel sounds active throughout. There are no masses palpable. No hepatomegaly. Rectal:  defered Neurological: Alert and oriented to person place and time. Skin: Skin is warm and dry. No rashes noted.  Data Reviewed: I have personally reviewed following labs and imaging studies  CBC: CBC Latest Ref Rng & Units 02/03/2016 05/01/2009  WBC 4.0 - 10.5 K/uL 7.0 -  Hemoglobin 13.0 - 17.0 g/dL 41.6 60.6  Hematocrit 30.1 - 52.0 % 42.0 -  Platelets 150 - 400 K/uL 197 -    CMP: CMP Latest Ref Rng & Units 02/03/2016  Glucose 65 - 99 mg/dL 601(U)  BUN 6 - 20 mg/dL 13  Creatinine 9.32 - 3.55 mg/dL 7.32  Sodium 202 - 542 mmol/L 136  Potassium 3.5 - 5.1 mmol/L 4.4  Chloride 101 - 111 mmol/L 104  CO2 22 - 32 mmol/L 26  Calcium 8.9 - 10.3 mg/dL 9.4  25 minutes spent with the patient today. Greater than 50% was spent in counseling and coordination of care with the patient     Edman Circle, MD 10/30/2019, 3:22 PM  Cc: Dr Sherral Hammers

## 2019-11-10 ENCOUNTER — Ambulatory Visit (INDEPENDENT_AMBULATORY_CARE_PROVIDER_SITE_OTHER): Payer: Self-pay | Admitting: *Deleted

## 2019-11-10 DIAGNOSIS — J309 Allergic rhinitis, unspecified: Secondary | ICD-10-CM

## 2019-11-23 ENCOUNTER — Ambulatory Visit (INDEPENDENT_AMBULATORY_CARE_PROVIDER_SITE_OTHER): Payer: Self-pay | Admitting: *Deleted

## 2019-11-23 DIAGNOSIS — J309 Allergic rhinitis, unspecified: Secondary | ICD-10-CM

## 2019-12-05 NOTE — Progress Notes (Signed)
VIALS EXP 12-04-20 

## 2019-12-06 DIAGNOSIS — J301 Allergic rhinitis due to pollen: Secondary | ICD-10-CM

## 2019-12-14 ENCOUNTER — Ambulatory Visit (INDEPENDENT_AMBULATORY_CARE_PROVIDER_SITE_OTHER): Payer: Self-pay | Admitting: *Deleted

## 2019-12-14 DIAGNOSIS — J309 Allergic rhinitis, unspecified: Secondary | ICD-10-CM

## 2020-01-05 ENCOUNTER — Ambulatory Visit (INDEPENDENT_AMBULATORY_CARE_PROVIDER_SITE_OTHER): Payer: Self-pay | Admitting: *Deleted

## 2020-01-05 DIAGNOSIS — J309 Allergic rhinitis, unspecified: Secondary | ICD-10-CM

## 2020-01-17 ENCOUNTER — Ambulatory Visit (INDEPENDENT_AMBULATORY_CARE_PROVIDER_SITE_OTHER): Payer: Self-pay | Admitting: *Deleted

## 2020-01-17 DIAGNOSIS — J309 Allergic rhinitis, unspecified: Secondary | ICD-10-CM

## 2020-02-01 ENCOUNTER — Ambulatory Visit (INDEPENDENT_AMBULATORY_CARE_PROVIDER_SITE_OTHER): Payer: Self-pay

## 2020-02-01 DIAGNOSIS — J309 Allergic rhinitis, unspecified: Secondary | ICD-10-CM

## 2020-02-19 ENCOUNTER — Ambulatory Visit (INDEPENDENT_AMBULATORY_CARE_PROVIDER_SITE_OTHER): Payer: Self-pay | Admitting: *Deleted

## 2020-02-19 DIAGNOSIS — J309 Allergic rhinitis, unspecified: Secondary | ICD-10-CM

## 2020-03-05 ENCOUNTER — Ambulatory Visit (INDEPENDENT_AMBULATORY_CARE_PROVIDER_SITE_OTHER): Payer: Self-pay

## 2020-03-05 DIAGNOSIS — J309 Allergic rhinitis, unspecified: Secondary | ICD-10-CM

## 2020-03-13 ENCOUNTER — Ambulatory Visit (INDEPENDENT_AMBULATORY_CARE_PROVIDER_SITE_OTHER): Payer: Self-pay | Admitting: *Deleted

## 2020-03-13 DIAGNOSIS — J309 Allergic rhinitis, unspecified: Secondary | ICD-10-CM

## 2020-03-20 ENCOUNTER — Ambulatory Visit (INDEPENDENT_AMBULATORY_CARE_PROVIDER_SITE_OTHER): Payer: Self-pay | Admitting: *Deleted

## 2020-03-20 DIAGNOSIS — J309 Allergic rhinitis, unspecified: Secondary | ICD-10-CM

## 2020-04-01 ENCOUNTER — Ambulatory Visit (INDEPENDENT_AMBULATORY_CARE_PROVIDER_SITE_OTHER): Payer: Self-pay | Admitting: *Deleted

## 2020-04-01 DIAGNOSIS — J309 Allergic rhinitis, unspecified: Secondary | ICD-10-CM

## 2020-04-22 ENCOUNTER — Ambulatory Visit (INDEPENDENT_AMBULATORY_CARE_PROVIDER_SITE_OTHER): Payer: Self-pay | Admitting: *Deleted

## 2020-04-22 DIAGNOSIS — J309 Allergic rhinitis, unspecified: Secondary | ICD-10-CM

## 2020-05-06 ENCOUNTER — Ambulatory Visit (INDEPENDENT_AMBULATORY_CARE_PROVIDER_SITE_OTHER): Payer: Self-pay | Admitting: *Deleted

## 2020-05-06 DIAGNOSIS — J309 Allergic rhinitis, unspecified: Secondary | ICD-10-CM

## 2020-05-15 ENCOUNTER — Ambulatory Visit (INDEPENDENT_AMBULATORY_CARE_PROVIDER_SITE_OTHER): Payer: Self-pay

## 2020-05-15 DIAGNOSIS — J309 Allergic rhinitis, unspecified: Secondary | ICD-10-CM

## 2020-06-03 ENCOUNTER — Ambulatory Visit (INDEPENDENT_AMBULATORY_CARE_PROVIDER_SITE_OTHER): Payer: Self-pay

## 2020-06-03 DIAGNOSIS — J309 Allergic rhinitis, unspecified: Secondary | ICD-10-CM

## 2020-06-12 ENCOUNTER — Ambulatory Visit (INDEPENDENT_AMBULATORY_CARE_PROVIDER_SITE_OTHER): Payer: Self-pay | Admitting: *Deleted

## 2020-06-12 DIAGNOSIS — J309 Allergic rhinitis, unspecified: Secondary | ICD-10-CM

## 2020-06-17 DIAGNOSIS — J301 Allergic rhinitis due to pollen: Secondary | ICD-10-CM

## 2020-06-17 NOTE — Progress Notes (Signed)
VIALS EXP 06-17-21 °

## 2020-06-19 ENCOUNTER — Encounter: Payer: Self-pay | Admitting: Allergy and Immunology

## 2020-07-01 ENCOUNTER — Ambulatory Visit (INDEPENDENT_AMBULATORY_CARE_PROVIDER_SITE_OTHER): Payer: Self-pay | Admitting: *Deleted

## 2020-07-01 DIAGNOSIS — J309 Allergic rhinitis, unspecified: Secondary | ICD-10-CM

## 2020-07-16 ENCOUNTER — Other Ambulatory Visit: Payer: Self-pay

## 2020-07-16 ENCOUNTER — Encounter: Payer: Self-pay | Admitting: Allergy

## 2020-07-16 ENCOUNTER — Ambulatory Visit (INDEPENDENT_AMBULATORY_CARE_PROVIDER_SITE_OTHER): Payer: Self-pay | Admitting: Allergy

## 2020-07-16 VITALS — BP 118/78 | HR 72 | Resp 16

## 2020-07-16 DIAGNOSIS — H1013 Acute atopic conjunctivitis, bilateral: Secondary | ICD-10-CM

## 2020-07-16 DIAGNOSIS — J3089 Other allergic rhinitis: Secondary | ICD-10-CM

## 2020-07-16 MED ORDER — AZELASTINE HCL 0.1 % NA SOLN: NASAL | 11 refills | Status: AC

## 2020-07-16 NOTE — Patient Instructions (Addendum)
Allergic rhinitis with conjunctivitis   - Continue avoidance measures for grass pollen, ragweed pollen, molds, dust mites, cat, dog, cockroach   - for nasal drainage/post-nasal drip use of nasal antihistamine, Astelin 2 sprays each nostril 1-2 times a day as needed   - for itchy/watery/red eyes can use of Pazeo or Pataday eye drop 1 drop each eye daily as needed   -Continue allergen immunotherapy per schedule and have access to your epinephrine device.  You are in the maintenance vial! Will continue for the next year and will discuss at next visit when/if can stop and re-testing options  Follow-up 12 months or sooner if needed

## 2020-07-16 NOTE — Progress Notes (Signed)
Follow-up Note  RE: Jack Mills MRN: [id number] DOB: Apr 27, 1971 Date of Office Visit: 07/16/2020   History of present illness: Jack Mills is a 49 y.o. male presenting today for follow-up of allergic rhinitis with conjunctivitis as well as adverse food reaction.  He was last seen in the office on 03/15/2018 by myself.  He states he is doing much better than he was last year.  He reports he became super sick around October or November 2020 and at that time decided to make some lifestyle changes.  He states he did change his diet to be more natural and plant-based and to cut out processed, starch/gluten foods.  He has gone from about 220 pounds to 180-ish pounds and is feeling much better at this time. He also attributes starting allergen immunotherapy to his improved health.  He did start on allergen immunotherapy after his last visit and he is in the red vial at maintenance dosing a coming every 2 weeks at this time, very soon to be 3 weeks.  He states he has noted less freuqent days lost due to allergy symptoms.  He states he was heavily reliant on alka seltzer cold for allergy symptom relief and would take it throughout the day if he could.   He states he has not needed to use this and also reports less benadryl use since being on immunotherapy.  He also states the Astelin nasal spray has been very helpful for as needed use when he does have some nasal drainage.  He is no longer taking any antihistamines on a consistent basis.   Review of systems: Review of Systems  Constitutional: Negative.   HENT: Negative.   Eyes: Negative.   Respiratory: Negative.   Cardiovascular: Negative.   Gastrointestinal: Negative.   Musculoskeletal: Negative.   Skin: Negative.   Neurological: Negative.     All other systems negative unless noted above in HPI  Past medical/social/surgical/family history have been reviewed and are unchanged unless specifically indicated below.  No changes  Medication  List: Current Outpatient Medications  Medication Sig Dispense Refill  . ALPRAZolam (XANAX) 0.5 MG tablet Take 0.5 mg by mouth as needed.     Marland Kitchen AUVI-Q 0.3 MG/0.3ML SOAJ injection Use as directed for life threatening allergic reactions 2 Device 3  . hyoscyamine (LEVSIN SL) 0.125 MG SL tablet Place 1 tablet (0.125 mg total) under the tongue every 6 (six) hours as needed. 120 tablet 4  . omeprazole (PRILOSEC) 20 MG capsule     . tadalafil (CIALIS) 20 MG tablet Take by mouth.    Marland Kitchen azelastine (ASTELIN) 0.1 % nasal spray Use two sprays in each nostril twice daily if needed 30 mL 11   No current facility-administered medications for this visit.     Known medication allergies: Allergies  Allergen Reactions  . Latex Itching     Physical examination: Blood pressure 118/78, pulse 72, resp. rate 16, SpO2 95 %.  General: Alert, interactive, in no acute distress. HEENT: PERRLA, TMs pearly gray, turbinates non-edematous without discharge, post-pharynx non erythematous. Neck: Supple without lymphadenopathy. Lungs: Clear to auscultation without wheezing, rhonchi or rales. {no increased work of breathing. CV: Normal S1, S2 without murmurs. Abdomen: Nondistended, nontender. Skin: Warm and dry, without lesions or rashes. Extremities:  No clubbing, cyanosis or edema. Neuro:   Grossly intact.  Diagnositics/Labs: None today  Assessment and plan:   Allergic rhinitis with conjunctivitis   - Continue avoidance measures for grass pollen, ragweed pollen, molds, dust mites, cat, dog,  cockroach   - for nasal drainage/post-nasal drip use of nasal antihistamine, Astelin 2 sprays each nostril 1-2 times a day as needed   - for itchy/watery/red eyes can use of Pazeo or Pataday eye drop 1 drop each eye daily as needed   -Continue allergen immunotherapy per schedule and have access to your epinephrine device.  You are in the maintenance vial! Will continue for the next year and will discuss at next visit when/if  can stop and re-testing options  Follow-up 12 months or sooner if needed  I appreciate the opportunity to take part in Jack Mills's care. Please do not hesitate to contact me with questions.  Sincerely,   Margo Aye, MD Allergy/Immunology [facility]

## 2020-08-06 ENCOUNTER — Ambulatory Visit (INDEPENDENT_AMBULATORY_CARE_PROVIDER_SITE_OTHER): Payer: Self-pay

## 2020-08-06 DIAGNOSIS — J309 Allergic rhinitis, unspecified: Secondary | ICD-10-CM

## 2020-08-20 ENCOUNTER — Ambulatory Visit (INDEPENDENT_AMBULATORY_CARE_PROVIDER_SITE_OTHER): Payer: Self-pay

## 2020-08-20 DIAGNOSIS — J309 Allergic rhinitis, unspecified: Secondary | ICD-10-CM

## 2020-10-03 ENCOUNTER — Ambulatory Visit (INDEPENDENT_AMBULATORY_CARE_PROVIDER_SITE_OTHER): Payer: Self-pay | Admitting: *Deleted

## 2020-10-03 DIAGNOSIS — J309 Allergic rhinitis, unspecified: Secondary | ICD-10-CM

## 2020-10-21 ENCOUNTER — Ambulatory Visit (INDEPENDENT_AMBULATORY_CARE_PROVIDER_SITE_OTHER): Payer: Self-pay | Admitting: *Deleted

## 2020-10-21 DIAGNOSIS — J309 Allergic rhinitis, unspecified: Secondary | ICD-10-CM

## 2020-11-14 ENCOUNTER — Ambulatory Visit (INDEPENDENT_AMBULATORY_CARE_PROVIDER_SITE_OTHER): Payer: Self-pay | Admitting: *Deleted

## 2020-11-14 DIAGNOSIS — J309 Allergic rhinitis, unspecified: Secondary | ICD-10-CM

## 2020-12-02 ENCOUNTER — Ambulatory Visit (INDEPENDENT_AMBULATORY_CARE_PROVIDER_SITE_OTHER): Payer: Self-pay | Admitting: *Deleted

## 2020-12-02 DIAGNOSIS — J309 Allergic rhinitis, unspecified: Secondary | ICD-10-CM

## 2021-01-02 ENCOUNTER — Ambulatory Visit (INDEPENDENT_AMBULATORY_CARE_PROVIDER_SITE_OTHER): Payer: Self-pay | Admitting: *Deleted

## 2021-01-02 DIAGNOSIS — J309 Allergic rhinitis, unspecified: Secondary | ICD-10-CM

## 2021-01-30 ENCOUNTER — Ambulatory Visit (INDEPENDENT_AMBULATORY_CARE_PROVIDER_SITE_OTHER): Payer: Self-pay | Admitting: *Deleted

## 2021-01-30 DIAGNOSIS — J309 Allergic rhinitis, unspecified: Secondary | ICD-10-CM

## 2021-02-18 ENCOUNTER — Ambulatory Visit (INDEPENDENT_AMBULATORY_CARE_PROVIDER_SITE_OTHER): Payer: Self-pay

## 2021-02-18 DIAGNOSIS — J309 Allergic rhinitis, unspecified: Secondary | ICD-10-CM

## 2021-03-13 ENCOUNTER — Ambulatory Visit (INDEPENDENT_AMBULATORY_CARE_PROVIDER_SITE_OTHER): Payer: Self-pay | Admitting: *Deleted

## 2021-03-13 DIAGNOSIS — J309 Allergic rhinitis, unspecified: Secondary | ICD-10-CM

## 2021-04-03 ENCOUNTER — Ambulatory Visit (INDEPENDENT_AMBULATORY_CARE_PROVIDER_SITE_OTHER): Payer: Self-pay | Admitting: *Deleted

## 2021-04-03 DIAGNOSIS — J309 Allergic rhinitis, unspecified: Secondary | ICD-10-CM

## 2021-04-28 DIAGNOSIS — J301 Allergic rhinitis due to pollen: Secondary | ICD-10-CM

## 2021-04-28 NOTE — Progress Notes (Signed)
VIALS MADE. EXP 04-28-22 

## 2021-05-08 ENCOUNTER — Ambulatory Visit (INDEPENDENT_AMBULATORY_CARE_PROVIDER_SITE_OTHER): Payer: Self-pay | Admitting: *Deleted

## 2021-05-08 DIAGNOSIS — J309 Allergic rhinitis, unspecified: Secondary | ICD-10-CM

## 2021-06-24 ENCOUNTER — Ambulatory Visit (INDEPENDENT_AMBULATORY_CARE_PROVIDER_SITE_OTHER): Payer: Self-pay | Admitting: *Deleted

## 2021-06-24 DIAGNOSIS — J309 Allergic rhinitis, unspecified: Secondary | ICD-10-CM

## 2021-07-10 ENCOUNTER — Ambulatory Visit (INDEPENDENT_AMBULATORY_CARE_PROVIDER_SITE_OTHER): Payer: Self-pay | Admitting: *Deleted

## 2021-07-10 DIAGNOSIS — J309 Allergic rhinitis, unspecified: Secondary | ICD-10-CM

## 2021-07-30 ENCOUNTER — Ambulatory Visit (INDEPENDENT_AMBULATORY_CARE_PROVIDER_SITE_OTHER): Payer: Self-pay | Admitting: *Deleted

## 2021-07-30 DIAGNOSIS — J309 Allergic rhinitis, unspecified: Secondary | ICD-10-CM

## 2021-08-28 ENCOUNTER — Ambulatory Visit (INDEPENDENT_AMBULATORY_CARE_PROVIDER_SITE_OTHER): Payer: Self-pay | Admitting: *Deleted

## 2021-08-28 DIAGNOSIS — J309 Allergic rhinitis, unspecified: Secondary | ICD-10-CM

## 2021-09-10 ENCOUNTER — Ambulatory Visit (INDEPENDENT_AMBULATORY_CARE_PROVIDER_SITE_OTHER): Payer: Self-pay | Admitting: *Deleted

## 2021-09-10 DIAGNOSIS — J309 Allergic rhinitis, unspecified: Secondary | ICD-10-CM

## 2021-12-02 ENCOUNTER — Ambulatory Visit (INDEPENDENT_AMBULATORY_CARE_PROVIDER_SITE_OTHER): Payer: Self-pay | Admitting: *Deleted

## 2021-12-02 DIAGNOSIS — J309 Allergic rhinitis, unspecified: Secondary | ICD-10-CM

## 2022-01-14 ENCOUNTER — Ambulatory Visit (INDEPENDENT_AMBULATORY_CARE_PROVIDER_SITE_OTHER): Payer: Self-pay | Admitting: *Deleted

## 2022-01-14 DIAGNOSIS — J309 Allergic rhinitis, unspecified: Secondary | ICD-10-CM

## 2022-02-26 ENCOUNTER — Ambulatory Visit (INDEPENDENT_AMBULATORY_CARE_PROVIDER_SITE_OTHER): Payer: Self-pay | Admitting: *Deleted

## 2022-02-26 DIAGNOSIS — J309 Allergic rhinitis, unspecified: Secondary | ICD-10-CM

## 2022-04-13 ENCOUNTER — Ambulatory Visit (INDEPENDENT_AMBULATORY_CARE_PROVIDER_SITE_OTHER): Payer: Self-pay | Admitting: *Deleted

## 2022-04-13 DIAGNOSIS — J309 Allergic rhinitis, unspecified: Secondary | ICD-10-CM

## 2022-04-29 DIAGNOSIS — J3089 Other allergic rhinitis: Secondary | ICD-10-CM

## 2022-04-29 NOTE — Progress Notes (Signed)
VIALS EXP 04-30-23 

## 2022-05-04 ENCOUNTER — Ambulatory Visit (INDEPENDENT_AMBULATORY_CARE_PROVIDER_SITE_OTHER): Payer: Self-pay | Admitting: *Deleted

## 2022-05-04 DIAGNOSIS — J309 Allergic rhinitis, unspecified: Secondary | ICD-10-CM

## 2022-07-09 ENCOUNTER — Ambulatory Visit (INDEPENDENT_AMBULATORY_CARE_PROVIDER_SITE_OTHER): Payer: Self-pay | Admitting: *Deleted

## 2022-07-09 DIAGNOSIS — J309 Allergic rhinitis, unspecified: Secondary | ICD-10-CM

## 2022-08-07 ENCOUNTER — Ambulatory Visit (INDEPENDENT_AMBULATORY_CARE_PROVIDER_SITE_OTHER): Payer: Self-pay | Admitting: *Deleted

## 2022-08-07 DIAGNOSIS — J309 Allergic rhinitis, unspecified: Secondary | ICD-10-CM

## 2022-09-08 ENCOUNTER — Ambulatory Visit (INDEPENDENT_AMBULATORY_CARE_PROVIDER_SITE_OTHER): Payer: Self-pay

## 2022-09-08 DIAGNOSIS — J309 Allergic rhinitis, unspecified: Secondary | ICD-10-CM

## 2022-09-24 ENCOUNTER — Ambulatory Visit (INDEPENDENT_AMBULATORY_CARE_PROVIDER_SITE_OTHER): Payer: Self-pay | Admitting: *Deleted

## 2022-09-24 DIAGNOSIS — J309 Allergic rhinitis, unspecified: Secondary | ICD-10-CM

## 2022-10-19 ENCOUNTER — Ambulatory Visit (INDEPENDENT_AMBULATORY_CARE_PROVIDER_SITE_OTHER): Payer: Self-pay

## 2022-10-19 DIAGNOSIS — J309 Allergic rhinitis, unspecified: Secondary | ICD-10-CM

## 2022-11-10 ENCOUNTER — Ambulatory Visit (INDEPENDENT_AMBULATORY_CARE_PROVIDER_SITE_OTHER): Payer: Self-pay

## 2022-11-10 DIAGNOSIS — J309 Allergic rhinitis, unspecified: Secondary | ICD-10-CM
